# Patient Record
Sex: Female | Born: 1980 | Race: Asian | Hispanic: No | Marital: Married | Smoking: Never smoker
Health system: Southern US, Community
[De-identification: ages and names within clinical notes are randomized; demographics above are authoritative.]

## PROBLEM LIST (undated history)

## (undated) DIAGNOSIS — O24419 Gestational diabetes mellitus in pregnancy, unspecified control: Secondary | ICD-10-CM

## (undated) HISTORY — PX: NO PAST SURGERIES: SHX2092

---

## 2014-11-16 LAB — OB RESULTS CONSOLE HIV ANTIBODY (ROUTINE TESTING): HIV: NONREACTIVE

## 2014-11-16 LAB — OB RESULTS CONSOLE RPR: RPR: NONREACTIVE

## 2014-11-16 LAB — OB RESULTS CONSOLE HEPATITIS B SURFACE ANTIGEN: Hepatitis B Surface Ag: NEGATIVE

## 2014-11-16 LAB — OB RESULTS CONSOLE ABO/RH: RH Type: POSITIVE

## 2014-11-16 LAB — OB RESULTS CONSOLE RUBELLA ANTIBODY, IGM: Rubella: IMMUNE

## 2014-11-16 LAB — OB RESULTS CONSOLE ANTIBODY SCREEN: Antibody Screen: NEGATIVE

## 2014-11-26 LAB — OB RESULTS CONSOLE GC/CHLAMYDIA
CHLAMYDIA, DNA PROBE: NEGATIVE
GC PROBE AMP, GENITAL: NEGATIVE

## 2015-04-14 ENCOUNTER — Encounter: Payer: BLUE CROSS/BLUE SHIELD | Attending: Obstetrics & Gynecology | Admitting: *Deleted

## 2015-04-14 VITALS — Ht 62.0 in | Wt 118.7 lb

## 2015-04-14 DIAGNOSIS — R7302 Impaired glucose tolerance (oral): Secondary | ICD-10-CM | POA: Insufficient documentation

## 2015-04-14 DIAGNOSIS — R7309 Other abnormal glucose: Secondary | ICD-10-CM

## 2015-04-14 DIAGNOSIS — Z713 Dietary counseling and surveillance: Secondary | ICD-10-CM | POA: Diagnosis not present

## 2015-04-15 ENCOUNTER — Encounter: Payer: Self-pay | Admitting: *Deleted

## 2015-04-15 NOTE — Progress Notes (Signed)
  Patient was seen on 04/14/15 for Gestational Diabetes self-management visit at the [facility]. The following learning objectives were met by the patient during this visit:   States the definition of Gestational Diabetes  States why dietary management is important in controlling blood glucose  Describes the effects each nutrient has on blood glucose levels  Demonstrates ability to create a balanced meal plan  Demonstrates carbohydrate counting   States when to check blood glucose levels  Demonstrates proper blood glucose monitoring techniques  States the effect of stress and exercise on blood glucose levels  States the importance of limiting caffeine and abstaining from alcohol and smoking  Blood glucose monitor given:  One [employer] Self Monitoring Kit Lot # [id number] x Exp: 06/2016 Blood glucose reading: 96 mg/dl  Patient instructed to monitor glucose levels: FBS: 60 - <90 2 hour: <120  *Patient received handouts:  Nutrition Diabetes and Pregnancy  Carbohydrate Counting List  Patient will be seen for follow-up as needed.

## 2015-04-22 ENCOUNTER — Telehealth: Payer: Self-pay | Admitting: *Deleted

## 2015-04-22 NOTE — Telephone Encounter (Signed)
Husband states patient's BG too high @ 143 mg/dl a couple of days ago after eating a tangerine 1 hour after her lunch. Then yesterday they went to 121 mg/dl @ 2 hours after lunch. Follow up BG's were 97 and 107 mg/dl at 30 minute intervals after that. He questions the accuracy of the strips and whether they have the correct strips for the One Touch Verio Meter they are using.  I commented on the accuracy of current meters are required to have an accuracy within 15% so there will be some variation when checking BG close together. Also encouraged a protein to be included with pure carbohydrate foods to soften the BG excursion.  Suggested he call the meter company to obtain Control Solution so they can test the accuracy of the current vial of strips.

## 2015-05-31 LAB — OB RESULTS CONSOLE GBS: STREP GROUP B AG: NEGATIVE

## 2015-06-20 ENCOUNTER — Encounter (HOSPITAL_COMMUNITY)
Admission: AD | Disposition: A | Discharge status: Home / Self Care | Payer: Self-pay | Source: Ambulatory Visit | Attending: Obstetrics and Gynecology

## 2015-06-20 ENCOUNTER — Encounter (HOSPITAL_COMMUNITY): Payer: Self-pay | Admitting: *Deleted

## 2015-06-20 ENCOUNTER — Inpatient Hospital Stay (HOSPITAL_COMMUNITY): Payer: BLUE CROSS/BLUE SHIELD | Admitting: Anesthesiology

## 2015-06-20 ENCOUNTER — Inpatient Hospital Stay (HOSPITAL_COMMUNITY)
Admission: AD | Admit: 2015-06-20 | Discharge: 2015-06-20 | Disposition: A | Discharge status: Home / Self Care | Payer: BLUE CROSS/BLUE SHIELD | Source: Ambulatory Visit | Attending: Obstetrics and Gynecology | Admitting: Obstetrics and Gynecology

## 2015-06-20 ENCOUNTER — Inpatient Hospital Stay (HOSPITAL_COMMUNITY)
Admission: AD | Admit: 2015-06-20 | Discharge: 2015-06-23 | DRG: 766 | Disposition: A | Discharge status: Home / Self Care | Payer: BLUE CROSS/BLUE SHIELD | Source: Ambulatory Visit | Attending: Obstetrics and Gynecology | Admitting: Obstetrics and Gynecology

## 2015-06-20 DIAGNOSIS — Z3A39 39 weeks gestation of pregnancy: Secondary | ICD-10-CM

## 2015-06-20 DIAGNOSIS — O24420 Gestational diabetes mellitus in childbirth, diet controlled: Secondary | ICD-10-CM | POA: Diagnosis present

## 2015-06-20 DIAGNOSIS — E282 Polycystic ovarian syndrome: Secondary | ICD-10-CM | POA: Diagnosis present

## 2015-06-20 DIAGNOSIS — O4292 Full-term premature rupture of membranes, unspecified as to length of time between rupture and onset of labor: Secondary | ICD-10-CM | POA: Diagnosis present

## 2015-06-20 DIAGNOSIS — O322XX0 Maternal care for transverse and oblique lie, not applicable or unspecified: Secondary | ICD-10-CM | POA: Diagnosis present

## 2015-06-20 HISTORY — DX: Gestational diabetes mellitus in pregnancy, unspecified control: O24.419

## 2015-06-20 LAB — CBC
HCT: 37 % (ref 36.0–46.0)
HEMATOCRIT: 36.3 % (ref 36.0–46.0)
HEMOGLOBIN: 12.7 g/dL (ref 12.0–15.0)
HEMOGLOBIN: 12.3 g/dL (ref 12.0–15.0)
MCH: 32.5 pg (ref 26.0–34.0)
MCH: 32.3 pg (ref 26.0–34.0)
MCHC: 34.3 g/dL (ref 30.0–36.0)
MCHC: 33.9 g/dL (ref 30.0–36.0)
MCV: 94.6 fL (ref 78.0–100.0)
MCV: 95.3 fL (ref 78.0–100.0)
Platelets: 181 10*3/uL (ref 150–400)
Platelets: 175 10*3/uL (ref 150–400)
RBC: 3.91 MIL/uL (ref 3.87–5.11)
RBC: 3.81 MIL/uL — AB (ref 3.87–5.11)
RDW: 14.3 % (ref 11.5–15.5)
RDW: 14.4 % (ref 11.5–15.5)
WBC: 11.1 10*3/uL — ABNORMAL HIGH (ref 4.0–10.5)
WBC: 16.2 10*3/uL — ABNORMAL HIGH (ref 4.0–10.5)

## 2015-06-20 LAB — COMPREHENSIVE METABOLIC PANEL
ALK PHOS: 144 U/L — AB (ref 38–126)
ALT: 39 U/L (ref 14–54)
AST: 33 U/L (ref 15–41)
Albumin: 2.7 g/dL — ABNORMAL LOW (ref 3.5–5.0)
Anion gap: 9 (ref 5–15)
BUN: 10 mg/dL (ref 6–20)
CHLORIDE: 106 mmol/L (ref 101–111)
CO2: 21 mmol/L — AB (ref 22–32)
CREATININE: 0.87 mg/dL (ref 0.44–1.00)
Calcium: 8.5 mg/dL — ABNORMAL LOW (ref 8.9–10.3)
GFR calc Af Amer: >60 mL/min (ref >60)
GFR calc non Af Amer: >60 mL/min (ref >60)
GLUCOSE: 120 mg/dL — AB (ref 65–99)
Potassium: 3.8 mmol/L (ref 3.5–5.1)
SODIUM: 136 mmol/L (ref 135–145)
Total Bilirubin: 1 mg/dL (ref 0.3–1.2)
Total Protein: 6 g/dL — ABNORMAL LOW (ref 6.5–8.1)

## 2015-06-20 LAB — GLUCOSE, CAPILLARY
GLUCOSE-CAPILLARY: 75 mg/dL (ref 65–99)
Glucose-Capillary: 126 mg/dL — ABNORMAL HIGH (ref 65–99)
Glucose-Capillary: 111 mg/dL — ABNORMAL HIGH (ref 65–99)

## 2015-06-20 LAB — ABO/RH: ABO/RH(D): O POS

## 2015-06-20 LAB — PREPARE RBC (CROSSMATCH)

## 2015-06-20 SURGERY — Surgical Case
Anesthesia: Epidural

## 2015-06-20 MED ORDER — OXYTOCIN 10 UNIT/ML IJ SOLN
INTRAMUSCULAR | Status: AC
  Filled 2015-06-20: qty 4

## 2015-06-20 MED ORDER — TERBUTALINE SULFATE 1 MG/ML IJ SOLN: 0.2500 mg | Freq: Once | INTRAMUSCULAR | Status: DC | PRN

## 2015-06-20 MED ORDER — OXYCODONE-ACETAMINOPHEN 5-325 MG PO TABS: 1.0000 | ORAL_TABLET | ORAL | Status: DC | PRN

## 2015-06-20 MED ORDER — BUPIVACAINE HCL (PF) 0.25 % IJ SOLN
INTRAMUSCULAR | Status: AC
  Filled 2015-06-20: qty 30

## 2015-06-20 MED ORDER — KETOROLAC TROMETHAMINE 30 MG/ML IJ SOLN: 30.0000 mg | Freq: Four times a day (QID) | INTRAMUSCULAR | Status: DC | PRN

## 2015-06-20 MED ORDER — FENTANYL 2.5 MCG/ML BUPIVACAINE 1/10 % EPIDURAL INFUSION (WH - ANES): 14.0000 mL/h | INTRAMUSCULAR | Status: DC | PRN

## 2015-06-20 MED ORDER — LIDOCAINE HCL (PF) 1 % IJ SOLN
INTRAMUSCULAR | Status: DC | PRN
  Administered 2015-06-20 (×2): 5 mL

## 2015-06-20 MED ORDER — ACETAMINOPHEN 325 MG PO TABS: 650.0000 mg | ORAL_TABLET | ORAL | Status: DC | PRN

## 2015-06-20 MED ORDER — OXYCODONE-ACETAMINOPHEN 5-325 MG PO TABS: 2.0000 | ORAL_TABLET | ORAL | Status: DC | PRN

## 2015-06-20 MED ORDER — MORPHINE SULFATE (PF) 0.5 MG/ML IJ SOLN
INTRAMUSCULAR | Status: AC
  Filled 2015-06-20: qty 10

## 2015-06-20 MED ORDER — OXYTOCIN BOLUS FROM INFUSION: 500.0000 mL | INTRAVENOUS | Status: DC

## 2015-06-20 MED ORDER — FENTANYL 2.5 MCG/ML BUPIVACAINE 1/10 % EPIDURAL INFUSION (WH - ANES)
14.0000 mL/h | INTRAMUSCULAR | Status: DC | PRN
  Administered 2015-06-20: 14 mL/h via EPIDURAL
  Filled 2015-06-20 (×2): qty 125

## 2015-06-20 MED ORDER — SODIUM BICARBONATE 8.4 % IV SOLN
INTRAVENOUS | Status: DC | PRN
  Administered 2015-06-20: 2 mL via EPIDURAL
  Administered 2015-06-20 (×3): 5 mL via EPIDURAL

## 2015-06-20 MED ORDER — ONDANSETRON HCL 4 MG/2ML IJ SOLN
4.0000 mg | Freq: Four times a day (QID) | INTRAMUSCULAR | Status: DC | PRN
  Administered 2015-06-20 (×2): 4 mg via INTRAVENOUS
  Filled 2015-06-20: qty 2

## 2015-06-20 MED ORDER — EPHEDRINE 5 MG/ML INJ: 10.0000 mg | INTRAVENOUS | Status: DC | PRN

## 2015-06-20 MED ORDER — METOCLOPRAMIDE HCL 5 MG/ML IJ SOLN: 10.0000 mg | Freq: Once | INTRAMUSCULAR | Status: DC | PRN

## 2015-06-20 MED ORDER — MORPHINE SULFATE (PF) 0.5 MG/ML IJ SOLN
INTRAMUSCULAR | Status: DC | PRN
  Administered 2015-06-20 (×2): 1 mg via INTRAVENOUS
  Administered 2015-06-20: 3 mg via EPIDURAL

## 2015-06-20 MED ORDER — BUPIVACAINE HCL (PF) 0.25 % IJ SOLN
INTRAMUSCULAR | Status: DC | PRN
  Administered 2015-06-20: 8 mL

## 2015-06-20 MED ORDER — OXYTOCIN 10 UNIT/ML IJ SOLN
40.0000 [IU] | INTRAMUSCULAR | Status: DC | PRN
  Administered 2015-06-20: 40 [IU] via INTRAVENOUS

## 2015-06-20 MED ORDER — FENTANYL 2.5 MCG/ML BUPIVACAINE 1/10 % EPIDURAL INFUSION (WH - ANES)
INTRAMUSCULAR | Status: DC | PRN
  Administered 2015-06-20: 14 mL/h via EPIDURAL

## 2015-06-20 MED ORDER — ONDANSETRON HCL 4 MG/2ML IJ SOLN
INTRAMUSCULAR | Status: AC
  Filled 2015-06-20: qty 4

## 2015-06-20 MED ORDER — OXYTOCIN 40 UNITS IN LACTATED RINGERS INFUSION - SIMPLE MED
62.5000 mL/h | INTRAVENOUS | Status: DC
  Filled 2015-06-20: qty 1000

## 2015-06-20 MED ORDER — CEFAZOLIN SODIUM-DEXTROSE 2-3 GM-% IV SOLR
INTRAVENOUS | Status: AC
  Filled 2015-06-20: qty 50

## 2015-06-20 MED ORDER — METHYLERGONOVINE MALEATE 0.2 MG/ML IJ SOLN
INTRAMUSCULAR | Status: DC | PRN
  Administered 2015-06-20: 0.2 mg via INTRAMUSCULAR

## 2015-06-20 MED ORDER — MEPERIDINE HCL 25 MG/ML IJ SOLN: 6.2500 mg | INTRAMUSCULAR | Status: DC | PRN

## 2015-06-20 MED ORDER — FENTANYL CITRATE (PF) 100 MCG/2ML IJ SOLN: 25.0000 ug | INTRAMUSCULAR | Status: DC | PRN

## 2015-06-20 MED ORDER — LIDOCAINE HCL (PF) 1 % IJ SOLN
30.0000 mL | INTRAMUSCULAR | Status: DC | PRN
  Filled 2015-06-20: qty 30

## 2015-06-20 MED ORDER — DIPHENHYDRAMINE HCL 50 MG/ML IJ SOLN: 12.5000 mg | INTRAMUSCULAR | Status: DC | PRN

## 2015-06-20 MED ORDER — LACTATED RINGERS IV SOLN
INTRAVENOUS | Status: DC
  Administered 2015-06-20 (×5): via INTRAVENOUS

## 2015-06-20 MED ORDER — METHYLERGONOVINE MALEATE 0.2 MG/ML IJ SOLN
INTRAMUSCULAR | Status: AC
  Filled 2015-06-20: qty 1

## 2015-06-20 MED ORDER — OXYTOCIN 40 UNITS IN LACTATED RINGERS INFUSION - SIMPLE MED
1.0000 m[IU]/min | INTRAVENOUS | Status: DC
  Administered 2015-06-20: 2 m[IU]/min via INTRAVENOUS

## 2015-06-20 MED ORDER — LACTATED RINGERS IV SOLN
500.0000 mL | INTRAVENOUS | Status: DC | PRN
  Administered 2015-06-20: 500 mL via INTRAVENOUS

## 2015-06-20 MED ORDER — PHENYLEPHRINE 40 MCG/ML (10ML) SYRINGE FOR IV PUSH (FOR BLOOD PRESSURE SUPPORT)
80.0000 ug | PREFILLED_SYRINGE | INTRAVENOUS | Status: DC | PRN
  Filled 2015-06-20: qty 20

## 2015-06-20 MED ORDER — CITRIC ACID-SODIUM CITRATE 334-500 MG/5ML PO SOLN
30.0000 mL | ORAL | Status: DC | PRN
  Administered 2015-06-20: 30 mL via ORAL
  Filled 2015-06-20: qty 15

## 2015-06-20 MED ORDER — CEFAZOLIN SODIUM-DEXTROSE 2-3 GM-% IV SOLR
INTRAVENOUS | Status: DC | PRN
  Administered 2015-06-20: 2 g via INTRAVENOUS

## 2015-06-20 MED ORDER — FLEET ENEMA 7-19 GM/118ML RE ENEM: 1.0000 | ENEMA | RECTAL | Status: DC | PRN

## 2015-06-20 SURGICAL SUPPLY — 47 items
BARRIER ADHS 3X4 INTERCEED (GAUZE/BANDAGES/DRESSINGS) ×2 IMPLANT
BENZOIN TINCTURE PRP APPL 2/3 (GAUZE/BANDAGES/DRESSINGS) IMPLANT
CLAMP CORD UMBIL (MISCELLANEOUS) IMPLANT
CLOTH BEACON ORANGE TIMEOUT ST (SAFETY) ×2 IMPLANT
CONTAINER PREFILL 10% NBF 15ML (MISCELLANEOUS) IMPLANT
DRAPE C SECTION CLR SCREEN (DRAPES) ×2 IMPLANT
DRAPE SHEET LG 3/4 BI-LAMINATE (DRAPES) IMPLANT
DRSG OPSITE POSTOP 4X10 (GAUZE/BANDAGES/DRESSINGS) ×2 IMPLANT
DRSG TELFA 3X8 NADH (GAUZE/BANDAGES/DRESSINGS) ×2 IMPLANT
DURAPREP 26ML APPLICATOR (WOUND CARE) ×2 IMPLANT
ELECT REM PT RETURN 9FT ADLT (ELECTROSURGICAL) ×2
ELECTRODE REM PT RTRN 9FT ADLT (ELECTROSURGICAL) ×1 IMPLANT
EXTRACTOR VACUUM M CUP 4 TUBE (SUCTIONS) IMPLANT
GLOVE BIOGEL PI IND STRL 7.0 (GLOVE) ×2 IMPLANT
GLOVE BIOGEL PI INDICATOR 7.0 (GLOVE) ×2
GLOVE ECLIPSE 6.5 STRL STRAW (GLOVE) ×2 IMPLANT
GOWN STRL REUS W/TWL LRG LVL3 (GOWN DISPOSABLE) ×4 IMPLANT
KIT ABG SYR 3ML LUER SLIP (SYRINGE) IMPLANT
NEEDLE HYPO 22GX1.5 SAFETY (NEEDLE) ×2 IMPLANT
NEEDLE HYPO 25X5/8 SAFETYGLIDE (NEEDLE) IMPLANT
NS IRRIG 1000ML POUR BTL (IV SOLUTION) ×2 IMPLANT
PACK C SECTION WH (CUSTOM PROCEDURE TRAY) ×2 IMPLANT
PAD ABD 7.5X8 STRL (GAUZE/BANDAGES/DRESSINGS) ×4 IMPLANT
PAD OB MATERNITY 4.3X12.25 (PERSONAL CARE ITEMS) ×2 IMPLANT
RTRCTR C-SECT PINK 25CM LRG (MISCELLANEOUS) IMPLANT
SPONGE GAUZE 4X4 12PLY STER LF (GAUZE/BANDAGES/DRESSINGS) ×4 IMPLANT
SPONGE LAP 18X18 X RAY DECT (DISPOSABLE) ×4 IMPLANT
STAPLER VISISTAT 35W (STAPLE) ×2 IMPLANT
STRIP CLOSURE SKIN 1/2X4 (GAUZE/BANDAGES/DRESSINGS) IMPLANT
SUT CHROMIC GUT AB #0 18 (SUTURE) IMPLANT
SUT MNCRL 0 VIOLET CTX 36 (SUTURE) ×4 IMPLANT
SUT MON AB 2-0 SH 27 (SUTURE)
SUT MON AB 2-0 SH27 (SUTURE) IMPLANT
SUT MON AB 3-0 SH 27 (SUTURE)
SUT MON AB 3-0 SH27 (SUTURE) IMPLANT
SUT MON AB 4-0 PS1 27 (SUTURE) IMPLANT
SUT MONOCRYL 0 CTX 36 (SUTURE) ×4
SUT PLAIN 2 0 (SUTURE)
SUT PLAIN 2 0 XLH (SUTURE) IMPLANT
SUT PLAIN ABS 2-0 CT1 27XMFL (SUTURE) IMPLANT
SUT VIC AB 0 CT1 36 (SUTURE) ×4 IMPLANT
SUT VIC AB 2-0 CT1 27 (SUTURE) ×1
SUT VIC AB 2-0 CT1 TAPERPNT 27 (SUTURE) ×1 IMPLANT
SUT VIC AB 4-0 PS2 27 (SUTURE) IMPLANT
SYR CONTROL 10ML LL (SYRINGE) ×2 IMPLANT
TOWEL OR 17X24 6PK STRL BLUE (TOWEL DISPOSABLE) ×2 IMPLANT
TRAY FOLEY CATH SILVER 14FR (SET/KITS/TRAYS/PACK) IMPLANT

## 2015-06-20 NOTE — MAU Note (Signed)
Patient presents with PROM @ 0800

## 2015-06-20 NOTE — Progress Notes (Signed)
S; pushing for 1 1/2 hours  O: tracing: baseline 140 (+) accels Ctx q 2-4 mins  VE . Fully (+) 2 caput LOT  IMP: Complete Transverse presentation Class a1 GDM P) cont pushing ( RN plans hand and knees)

## 2015-06-20 NOTE — Anesthesia Postprocedure Evaluation (Signed)
Anesthesia Post Note  Patient: Lori Robertson  Procedure(s) Performed: Procedure(s) (LRB): CESAREAN SECTION (N/A)  Patient location during evaluation: PACU Anesthesia Type: Epidural Level of consciousness: awake and alert Pain management: pain level controlled Vital Signs Assessment: post-procedure vital signs reviewed and stable Respiratory status: spontaneous breathing, nonlabored ventilation and respiratory function stable Cardiovascular status: blood pressure returned to baseline Postop Assessment: no headache, no backache, epidural receding, patient able to bend at knees and no signs of nausea or vomiting Anesthetic complications: no    Last Vitals:  Filed Vitals:   06/20/15 2315 06/20/15 2330  BP: 102/68 106/75  Pulse: 85 81  Temp:  36.9 C  Resp: 18 17    Last Pain:  Filed Vitals:   06/20/15 2344  PainSc: 0-No pain                 [name] A.

## 2015-06-20 NOTE — Anesthesia Preprocedure Evaluation (Addendum)
Anesthesia Evaluation  Patient identified by MRN, date of birth, ID band Patient awake and Patient confused    Reviewed: Allergy & Precautions, H&P , NPO status , Patient's Chart, lab work & pertinent test results  Airway Mallampati: II       Dental   Pulmonary    Pulmonary exam normal breath sounds clear to auscultation       Cardiovascular Exercise Tolerance: Good Normal cardiovascular exam Rhythm:regular Rate:Normal     Neuro/Psych    GI/Hepatic   Endo/Other  diabetes, Well Controlled, Gestational  Renal/GU      Musculoskeletal   Abdominal   Peds  Hematology   Anesthesia Other Findings   Reproductive/Obstetrics (+) Pregnancy                            Anesthesia Physical Anesthesia Plan  ASA: II and emergent  Anesthesia Plan: Epidural   Post-op Pain Management:    Induction:   Airway Management Planned: Natural Airway  Additional Equipment:   Intra-op Plan:   Post-operative Plan:   Informed Consent: I have reviewed the patients History and Physical, chart, labs and discussed the procedure including the risks, benefits and alternatives for the proposed anesthesia with the patient or authorized representative who has indicated his/her understanding and acceptance.   Dental advisory given  Plan Discussed with: Anesthesiologist, CRNA and Surgeon  Anesthesia Plan Comments: (Patient for C/Section for failure to progress. Will use epidural for C/Section. M. Malen GauzeFoster, MD)       Anesthesia Quick Evaluation

## 2015-06-20 NOTE — MAU Note (Signed)
Pt reports ctx off and on all evening. Reports some bloody show and good fetal movement.

## 2015-06-20 NOTE — Anesthesia Procedure Notes (Signed)
Epidural Patient location during procedure: OB Start time: 06/20/2015 11:20 AM End time: 06/20/2015 11:40 AM  Staffing Anesthesiologist: Sebastian AcheMANNY, Jerrye Seebeck  Preanesthetic Checklist Completed: patient identified, site marked, surgical consent, pre-op evaluation, timeout performed, IV checked, risks and benefits discussed and monitors and equipment checked  Epidural Patient position: sitting Prep: site prepped and draped and DuraPrep Patient monitoring: heart rate, continuous pulse ox and blood pressure Approach: midline Location: L3-L4 Injection technique: LOR air  Needle:  Needle type: Tuohy  Needle gauge: 17 G Needle length: 9 cm and 9 Needle insertion depth: 5 cm Catheter type: closed end flexible Catheter size: 20 Guage Catheter at skin depth: 13 cm Test dose: negative  Assessment Events: blood not aspirated, injection not painful, no injection resistance, negative IV test and no paresthesia  Additional Notes   Patient tolerated the insertion well without complications.Reason for block:procedure for pain

## 2015-06-20 NOTE — Progress Notes (Signed)
Lori Robertson is a 34 y.o. G1P0 at 233w0d by ultrasound admitted for rupture of membranes  Subjective: Chief Complaint  Patient presents with  . Rupture of Membranes    Objective: BP 105/64 mmHg  Pulse 73  Temp(Src) 98.7 F (37.1 C) (Oral)  Resp 18  Ht 5\' 1"  (1.549 m)  Wt 54.885 kg (121 lb)  BMI 22.87 kg/m2  SpO2 98%      FHT:  FHR: 140 bpm, variability: moderate,  accelerations:  Present,  decelerations:  Absent UC:   regular, every 2-3 minutes SVE:   10/100/0 +1 station   Labs: Lab Results  Component Value Date   WBC 11.1* 06/20/2015   HGB 12.7 06/20/2015   HCT 37.0 06/20/2015   MCV 94.6 06/20/2015   PLT 181 06/20/2015    Assessment / Plan: complete  Class a1GDM P) labor vtx down   Anticipated MOD:  NSVD  Gotham Raden A 06/20/2015, 5:28 PM

## 2015-06-20 NOTE — H&P (Signed)
Lori Robertson is a 34 y.o. female presenting for labor and SROM at 0800.  Maternal Medical History:  Reason for admission: Rupture of membranes and contractions.   Contractions: Onset was 3-5 hours ago.   Frequency: irregular.   Perceived severity is mild.    Fetal activity: Perceived fetal activity is normal.   Last perceived fetal movement was within the past hour.    Prenatal complications: no prenatal complications Prenatal Complications - Diabetes: gestational.    OB History    Gravida Para Term Preterm AB TAB SAB Ectopic Multiple Living   1              Past Medical History  Diagnosis Date  . Gestational diabetes    Past Surgical History  Procedure Laterality Date  . No past surgeries     Family History: family history is not on file. Social History:  reports that she has never smoked. She does not have any smokeless tobacco history on file. She reports that she does not drink alcohol or use illicit drugs.   Prenatal Transfer Tool  Maternal Diabetes: Yes:  Diabetes Type:  Diet controlled Genetic Screening: Normal Maternal Ultrasounds/Referrals: Normal Fetal Ultrasounds or other Referrals:  None Maternal Substance Abuse:  No Significant Maternal Medications:  None Significant Maternal Lab Results:  None Other Comments:  None  Review of Systems  Constitutional: Negative.   All other systems reviewed and are negative.     Blood pressure 115/78, pulse 70, temperature 98.1 F (36.7 C), temperature source Oral, resp. rate 17. Maternal Exam:  Uterine Assessment: Contraction strength is mild.  Contraction frequency is irregular.   Abdomen: Patient reports no abdominal tenderness. Fetal presentation: vertex  Introitus: Normal vulva. Normal vagina.  Ferning test: positive.  Nitrazine test: positive. Amniotic fluid character: clear.  Pelvis: questionable for delivery.   Cervix: Cervix evaluated by digital exam.     Physical Exam  Nursing note and vitals  reviewed. Constitutional: She is oriented to person, place, and time. She appears well-developed and well-nourished.  HENT:  Head: Normocephalic and atraumatic.  Neck: Normal range of motion. Neck supple.  Cardiovascular: Normal rate and regular rhythm.   Respiratory: Effort normal and breath sounds normal.  GI: Soft. Bowel sounds are normal.  Genitourinary: Vagina normal and uterus normal.  Musculoskeletal: Normal range of motion.  Neurological: She is alert and oriented to person, place, and time. She has normal reflexes.  Skin: Skin is warm and dry.  Psychiatric: She has a normal mood and affect.    Prenatal labs: ABO, Rh: O/Positive/-- (05/03 0000) Antibody: Negative (05/03 0000) Rubella: Immune (05/03 0000) RPR: Nonreactive (05/03 0000)  HBsAg: Negative (05/03 0000)  HIV: Non-reactive (05/03 0000)  GBS: Negative (11/15 0000)   Assessment/Plan: SROM at term GDM Admit   Tyray Proch J 06/20/2015, 8:55 AM

## 2015-06-20 NOTE — Brief Op Note (Signed)
06/20/2015  10:55 PM  PATIENT:  Lori Robertson  34 y.o. female  PRE-OPERATIVE DIAGNOSIS:  Arrest of Descent, Class A1 GDM, Term gestation  POST-OPERATIVE DIAGNOSIS:  Arrest of Descent, Class A1 GDM, Term gestation,  PROCEDURE:  Primary Cesarean section, Sharl MaKerr Hysterotomy  SURGEON:  Surgeon(s) and Role:    * Maxie BetterSheronette Cainen Burnham, MD - Primary  PHYSICIAN ASSISTANT:   ASSISTANTS: Donette LarryMelanie Bhambri, CNM   ANESTHESIA:   epidural FINDINGS: 2.5-3.0 cm post left SS fibroid, nl tubes, polycystic ovaries, live female ROA Asynclitic,  Apgar  8/9  EBL:  Total I/O In: 2500 [I.V.:2500] Out: 1300 [Urine:400; Blood:900]  BLOOD ADMINISTERED:none  DRAINS: none   LOCAL MEDICATIONS USED:  MARCAINE     SPECIMEN:  No Specimen  DISPOSITION OF SPECIMEN:  N/A  COUNTS:  YES  TOURNIQUET:  * No tourniquets in log *  DICTATION: .Other Dictation: Dictation Number [id number]  PLAN OF CARE: Admit to inpatient   PATIENT DISPOSITION:  PACU - hemodynamically stable.   Delay start of Pharmacological VTE agent (>24hrs) due to surgical blood loss or risk of bleeding: no

## 2015-06-20 NOTE — MAU Note (Signed)
Notified provider that patient came in and is fern test positive. Provider said to admit patient.

## 2015-06-20 NOTE — Transfer of Care (Signed)
Immediate Anesthesia Transfer of Care Note  Patient: Lori Robertson  Procedure(s) Performed: Procedure(s): CESAREAN SECTION (N/A)  Patient Location: PACU  Anesthesia Type:Epidural  Level of Consciousness: awake, alert  and oriented  Airway & Oxygen Therapy: Patient Spontanous Breathing  Post-op Assessment: Report given to RN and Post -op Vital signs reviewed and stable  Post vital signs: Reviewed and stable  Last Vitals:  Filed Vitals:   06/20/15 2134 06/20/15 2136  BP: 107/65 104/71  Pulse: 85 84  Temp:  37.6 C  Resp:  16    Complications: No apparent anesthesia complications

## 2015-06-20 NOTE — Consult Note (Signed)
[facility]  Delivery Note:  C-section       06/20/2015  10:07 PM  I was called to the operating room at the request of the patient's obstetrician (Dr. Cousins) for a primary c-section.  PRENATAL HX:  This is a 34 y/o G1P0 at 39 weeks admitted this morning for SROM at 0800 (ROM 14 hours).  Her pregnancy has been complicated by diet controlled GDM.  Delivery is by c-section for failure to progress.   DELIVERY:  Infant was vigorous at delivery, requiring no resuscitation other than standard warming, drying and stimulation.  APGARs 8 and 9.  Exam notable for mild molding and moderate caput, otherwise within normal limits.  After 5 minutes, baby left with nurse to assist parents with skin-to-skin care.   _____________________ Electronically Signed By: Timea Breed, MD Neonatologist 

## 2015-06-20 NOTE — Progress Notes (Signed)
CBG 75. °

## 2015-06-21 ENCOUNTER — Encounter (HOSPITAL_COMMUNITY): Payer: Self-pay | Admitting: Anesthesiology

## 2015-06-21 ENCOUNTER — Encounter (HOSPITAL_COMMUNITY): Payer: Self-pay | Admitting: Obstetrics and Gynecology

## 2015-06-21 LAB — DIC (DISSEMINATED INTRAVASCULAR COAGULATION)PANEL
Fibrinogen: 478 mg/dL — ABNORMAL HIGH (ref 204–475)
Platelets: 160 10*3/uL (ref 150–400)
Smear Review: NONE SEEN
aPTT: 26 seconds (ref 24–37)

## 2015-06-21 LAB — CBC
HCT: 35.1 % — ABNORMAL LOW (ref 36.0–46.0)
Hemoglobin: 12.1 g/dL (ref 12.0–15.0)
MCH: 32.4 pg (ref 26.0–34.0)
MCHC: 34.5 g/dL (ref 30.0–36.0)
MCV: 93.9 fL (ref 78.0–100.0)
PLATELETS: 166 10*3/uL (ref 150–400)
RBC: 3.74 MIL/uL — ABNORMAL LOW (ref 3.87–5.11)
RDW: 14.4 % (ref 11.5–15.5)
WBC: 13.5 10*3/uL — ABNORMAL HIGH (ref 4.0–10.5)

## 2015-06-21 LAB — DIC (DISSEMINATED INTRAVASCULAR COAGULATION) PANEL
D DIMER QUANT: 8.49 ug{FEU}/mL — AB (ref 0.00–0.50)
INR: 0.91 (ref 0.00–1.49)
PROTHROMBIN TIME: 12.5 s (ref 11.6–15.2)

## 2015-06-21 LAB — RPR: RPR: NONREACTIVE

## 2015-06-21 LAB — GLUCOSE, CAPILLARY: GLUCOSE-CAPILLARY: 101 mg/dL — AB (ref 65–99)

## 2015-06-21 MED ORDER — LACTATED RINGERS IV SOLN
INTRAVENOUS | Status: DC
  Administered 2015-06-21: 125 mL/h via INTRAVENOUS

## 2015-06-21 MED ORDER — DIBUCAINE 1 % RE OINT: 1.0000 "application " | TOPICAL_OINTMENT | RECTAL | Status: DC | PRN

## 2015-06-21 MED ORDER — OXYTOCIN 40 UNITS IN LACTATED RINGERS INFUSION - SIMPLE MED: 62.5000 mL/h | INTRAVENOUS | Status: AC

## 2015-06-21 MED ORDER — ZOLPIDEM TARTRATE 5 MG PO TABS: 5.0000 mg | ORAL_TABLET | Freq: Every evening | ORAL | Status: DC | PRN

## 2015-06-21 MED ORDER — OXYCODONE-ACETAMINOPHEN 5-325 MG PO TABS
1.0000 | ORAL_TABLET | ORAL | Status: DC | PRN
  Administered 2015-06-22 – 2015-06-23 (×3): 1 via ORAL
  Filled 2015-06-21 (×3): qty 1

## 2015-06-21 MED ORDER — NALOXONE HCL 0.4 MG/ML IJ SOLN: 0.4000 mg | INTRAMUSCULAR | Status: DC | PRN

## 2015-06-21 MED ORDER — FLEET ENEMA 7-19 GM/118ML RE ENEM: 1.0000 | ENEMA | Freq: Every day | RECTAL | Status: DC | PRN

## 2015-06-21 MED ORDER — DIPHENHYDRAMINE HCL 25 MG PO CAPS: 25.0000 mg | ORAL_CAPSULE | Freq: Four times a day (QID) | ORAL | Status: DC | PRN

## 2015-06-21 MED ORDER — SIMETHICONE 80 MG PO CHEW
80.0000 mg | CHEWABLE_TABLET | ORAL | Status: DC
  Administered 2015-06-22 – 2015-06-23 (×2): 80 mg via ORAL
  Filled 2015-06-21 (×2): qty 1

## 2015-06-21 MED ORDER — NALBUPHINE HCL 10 MG/ML IJ SOLN: 5.0000 mg | INTRAMUSCULAR | Status: DC | PRN

## 2015-06-21 MED ORDER — SIMETHICONE 80 MG PO CHEW
80.0000 mg | CHEWABLE_TABLET | ORAL | Status: DC | PRN
  Administered 2015-06-22: 80 mg via ORAL

## 2015-06-21 MED ORDER — IBUPROFEN 600 MG PO TABS
600.0000 mg | ORAL_TABLET | Freq: Four times a day (QID) | ORAL | Status: DC | PRN
  Administered 2015-06-21: 600 mg via ORAL

## 2015-06-21 MED ORDER — NALOXONE HCL 2 MG/2ML IJ SOSY
1.0000 ug/kg/h | PREFILLED_SYRINGE | INTRAMUSCULAR | Status: DC | PRN
  Filled 2015-06-21: qty 2

## 2015-06-21 MED ORDER — SODIUM CHLORIDE 0.9 % IJ SOLN
3.0000 mL | Freq: Two times a day (BID) | INTRAMUSCULAR | Status: DC
Start: 2015-06-21 — End: 2015-06-22
  Administered 2015-06-21: 3 mL via INTRAVENOUS

## 2015-06-21 MED ORDER — BISACODYL 10 MG RE SUPP: 10.0000 mg | Freq: Every day | RECTAL | Status: DC | PRN

## 2015-06-21 MED ORDER — METHYLERGONOVINE MALEATE 0.2 MG PO TABS
0.2000 mg | ORAL_TABLET | Freq: Four times a day (QID) | ORAL | Status: DC
  Administered 2015-06-21 – 2015-06-22 (×5): 0.2 mg via ORAL
  Filled 2015-06-21 (×5): qty 1

## 2015-06-21 MED ORDER — IBUPROFEN 600 MG PO TABS
600.0000 mg | ORAL_TABLET | Freq: Four times a day (QID) | ORAL | Status: DC
  Administered 2015-06-21 – 2015-06-23 (×8): 600 mg via ORAL
  Filled 2015-06-21 (×10): qty 1

## 2015-06-21 MED ORDER — FERROUS SULFATE 325 (65 FE) MG PO TABS
325.0000 mg | ORAL_TABLET | Freq: Two times a day (BID) | ORAL | Status: DC
  Administered 2015-06-21 – 2015-06-22 (×3): 325 mg via ORAL
  Filled 2015-06-21 (×3): qty 1

## 2015-06-21 MED ORDER — MENTHOL 3 MG MT LOZG: 1.0000 | LOZENGE | OROMUCOSAL | Status: DC | PRN

## 2015-06-21 MED ORDER — NALBUPHINE HCL 10 MG/ML IJ SOLN: 5.0000 mg | Freq: Once | INTRAMUSCULAR | Status: DC | PRN

## 2015-06-21 MED ORDER — DIPHENHYDRAMINE HCL 50 MG/ML IJ SOLN: 12.5000 mg | INTRAMUSCULAR | Status: DC | PRN

## 2015-06-21 MED ORDER — SODIUM CHLORIDE 0.9 % IV SOLN: 250.0000 mL | INTRAVENOUS | Status: DC

## 2015-06-21 MED ORDER — ONDANSETRON HCL 4 MG/2ML IJ SOLN: 4.0000 mg | Freq: Three times a day (TID) | INTRAMUSCULAR | Status: DC | PRN

## 2015-06-21 MED ORDER — DIPHENHYDRAMINE HCL 25 MG PO CAPS: 25.0000 mg | ORAL_CAPSULE | ORAL | Status: DC | PRN

## 2015-06-21 MED ORDER — SENNOSIDES-DOCUSATE SODIUM 8.6-50 MG PO TABS
2.0000 | ORAL_TABLET | ORAL | Status: DC
  Administered 2015-06-22 – 2015-06-23 (×2): 2 via ORAL
  Filled 2015-06-21 (×2): qty 2

## 2015-06-21 MED ORDER — METHYLERGONOVINE MALEATE 0.2 MG/ML IJ SOLN: 0.2000 mg | Freq: Four times a day (QID) | INTRAMUSCULAR | Status: DC

## 2015-06-21 MED ORDER — PRENATAL MULTIVITAMIN CH
1.0000 | ORAL_TABLET | Freq: Every day | ORAL | Status: DC
  Administered 2015-06-21 – 2015-06-23 (×3): 1 via ORAL
  Filled 2015-06-21 (×3): qty 1

## 2015-06-21 MED ORDER — SODIUM CHLORIDE 0.9 % IJ SOLN: 3.0000 mL | INTRAMUSCULAR | Status: DC | PRN

## 2015-06-21 MED ORDER — SIMETHICONE 80 MG PO CHEW
80.0000 mg | CHEWABLE_TABLET | Freq: Three times a day (TID) | ORAL | Status: DC
  Administered 2015-06-21 – 2015-06-23 (×7): 80 mg via ORAL
  Filled 2015-06-21 (×8): qty 1

## 2015-06-21 MED ORDER — WITCH HAZEL-GLYCERIN EX PADS: 1.0000 "application " | MEDICATED_PAD | CUTANEOUS | Status: DC | PRN

## 2015-06-21 MED ORDER — ACETAMINOPHEN 325 MG PO TABS
650.0000 mg | ORAL_TABLET | ORAL | Status: DC | PRN
  Administered 2015-06-21: 650 mg via ORAL
  Filled 2015-06-21: qty 2

## 2015-06-21 MED ORDER — LANOLIN HYDROUS EX OINT: 1.0000 "application " | TOPICAL_OINTMENT | CUTANEOUS | Status: DC | PRN

## 2015-06-21 MED ORDER — OXYCODONE-ACETAMINOPHEN 5-325 MG PO TABS: 2.0000 | ORAL_TABLET | ORAL | Status: DC | PRN

## 2015-06-21 NOTE — Op Note (Signed)
Lori Robertson, Lori Robertson                    ACCOUNT NO.:  [id number]  MEDICAL RECORD NO.:  [id number]  LOCATION:  [facility]                          FACILITY:  [facility]  PHYSICIAN:  Maxie Better, M.D.DATE OF BIRTH:  06-Nov-1980  DATE OF PROCEDURE:  06/20/2015 DATE OF DISCHARGE:                              OPERATIVE REPORT   PREOPERATIVE DIAGNOSIS:  Arrest of descent, class A1 gestational diabetes, term gestation.  PROCEDURE:  Primary cesarean section, Kerr hysterotomy.  POSTOPERATIVE DIAGNOSIS:  Arrest of descent, class A1 gestational diabetes, term gestation.  ANESTHESIA:  Epidural.  SURGEON:  Maxie Better, MD  ASSISTANT:  Donette Larry, CNM  PROCEDURE IN DETAIL:  Under adequate epidural anesthesia, the patient was placed in the supine position with a left lateral tilt.  She was sterilely prepped and draped in usual fashion.  Indwelling Foley catheter was already in place.  0.25% Marcaine was injected along the planned Pfannenstiel skin incision.  Pfannenstiel skin incision was then made, carried down to the rectus fascia.  Rectus fascia was opened transversely.  The rectus fascia was then bluntly and sharply dissected off the rectus muscle in superior and inferior fashion.  The rectus muscle was split in midline.  The parietal peritoneum was entered bluntly and extended.  A self-retaining Alexis retractor was then placed.  The vesicouterine peritoneum was opened transversely.  The bladder was then bluntly dissected off the lower uterine segment and displaced inferiorly.  A curvilinear low transverse uterine incision was then made and extended with bandage scissors.  The attempted initial delivery of the baby who was in the right occiput anterior position with asynclitic head was unsuccessful and assistance from the nurse to push the baby upward was requested and performed with subsequently dislodgement of the head and delivery of a live female who was bulb suctioned.  The  abdomen and cord were clamped, cut.  The baby was transferred to the awaiting pediatricians who assigned Apgars 8 and 9 at 1 and 5 minutes.  The placenta was manually removed.  Uterine cavity was cleaned of debris.  Uterine atony was noted.   Manual fundal massage was performed.  Uterine incision had no extension, was initially closed in 2 layers, 0 Monocryl running locked stitch, 1st layer; 2nd layer was imbricated with 0 Monocryl suture.  It was then noted that there was diffuse bleeding from the incision site itself resulted in the 3rd layer of 0 Monocryl running stitch being placed.  Bleeding was still noted at that point, a request for a DIC panel done.  The CBC was done and some cauterization was also done and warm compress was also placed on the incision and uterus was compressed to extrude any clots within the cavity itself.  Methergine was given as well.  Of note, the patient at the site where the testing for her level of anesthesia showed multiple petechial type redness where she was pinched.  Nonetheless, normal tubes were noted bilaterally.  Polycystic like ovaries were noted bilaterally and a left 2.5 to 3 cm subserosal fibroid posterior to that left tube was noted and a smaller subserosal on the right about a  1 cm was noted posteriorly as  well.  Another figure- of-eight suture was placed for the site of bleeding.  Small bleeding of peritoneum site was performed.  The preoperative platelet from admission was 181,000.  The patient was normotensive.  Comprehensive metabolic panel was also requested as well.  The bleeding seemed to then abated and at that point, Interceed was placed overlying in the inverted T fashion.  The parietal peritoneum was then closed with 2-0 Vicryl.  The rectus fascia was closed with 0 Vicryl x2.  The subcutaneous area was irrigated and good hemostasis was noted and the skin was approximated with Ethicon staples and a pressure dressing  then placed.  SPECIMEN:  Placenta not sent to Pathology.  ESTIMATED BLOOD LOSS:  900 mL.  URINE OUTPUT:  250.  INTRAOPERATIVE FLUID:  About 2500 mL.  The patient tolerated the procedure well, was transferred to recovery room in stable condition.  Her DIC labs are still pending at the time of this dictation.     Maxie BetterSheronette Doyal Saric, M.D.     [place]/MEDQ  D:  06/20/2015  T:  06/21/2015  Job:  [id number]

## 2015-06-21 NOTE — Anesthesia Postprocedure Evaluation (Signed)
Anesthesia Post Note  Patient: Lori Robertson  Procedure(s) Performed: * No procedures listed *  Patient location during evaluation: Mother Baby Anesthesia Type: Epidural Level of consciousness: awake and alert Pain management: pain level controlled Vital Signs Assessment: post-procedure vital signs reviewed and stable Respiratory status: spontaneous breathing Cardiovascular status: stable Postop Assessment: no headache, no backache, epidural receding, patient able to bend at knees and no signs of nausea or vomiting Anesthetic complications: no    Last Vitals:  Filed Vitals:   06/21/15 0515 06/21/15 0520  BP: 99/59 90/59  Pulse: 88 99  Temp:    Resp:      Last Pain:  Filed Vitals:   06/21/15 0542  PainSc: 2                  Edison PaceWILKERSON,Sabella Traore

## 2015-06-21 NOTE — Progress Notes (Signed)
Patient ID: Lori Robertson, female   DOB: August 21, 1980, 34 y.o.   MRN: [id number] Subjective: S/P Primary Cesarean Delivery for Arrest of Descent POD# 1 Information for the patient's newborn:  Daivd CouncilBui, Girl Reyes Ivanhao [[id number]][[id number]]  female  Reports feeling sore and hard to move around. Feeding: breast Patient reports tolerating PO.  Breast symptoms: none Pain controlled with ibuprofen (OTC) and narcotic analgesics including Percocet Denies HA/SOB/C/P/N/V/dizziness. Flatus present. No BM. She reports vaginal bleeding as normal, without clots.  She is ambulating, Foley indwelling, draining clear urine to gravity - RN to remove ASAP.    Objective:   VS:  Filed Vitals:   06/21/15 0323 06/21/15 0508 06/21/15 0515 06/21/15 0520  BP: 107/63 103/54 99/59 90/59   Pulse: 72 75 88 99  Temp: 98.8 F (37.1 C) 98.8 F (37.1 C)    TempSrc:      Resp: 20 18    Height:      Weight:      SpO2: 96% 97%       Intake/Output Summary (Last 24 hours) at 06/21/15 0839 Last data filed at 06/21/15 0032  Gross per 24 hour  Intake   2587 ml  Output   2500 ml  Net     87 ml        Recent Labs  06/20/15 2245 06/21/15 0549  WBC 16.2* 13.5*  HGB 12.3 12.1  HCT 36.3 35.1*  PLT 160  175 166     Blood type: O POS (12/05 1035)  Rubella: Immune (05/03 0000)     Physical Exam:   General: alert, cooperative, fatigued and no distress  CV: Regular rate and rhythm, S1S2 present or without murmur or extra heart sounds  Resp: clear  Abdomen: soft, nontender, normal bowel sounds  Incision: clean, dry, intact and pressure dressing with Tefla dressing underneath (no Honeycomb)  Uterine Fundus: firm, umbilicus even, nontender  Lochia: minimal  Ext: extremities normal, atraumatic, no cyanosis or edema, Homans sign is negative, no sign of DVT and no edema, redness or tenderness in the calves or thighs - SCD hose in place   Assessment/Plan: 34 y.o.   POD# 1.  S/P Cesarean Delivery.  Indications: arrest of descent                 Principal Problem:   Postpartum care following cesarean delivery (12/5) Active Problems:   Full-term premature rupture of membranes  Doing well, stable.               Regular diet as tolerated D/C foley & IV per unit protocol Ambulate Routine post-op care  Kenard GowerAWSON, Birttany Dechellis, M, MSN, CNM 06/21/2015, 8:39 AM

## 2015-06-21 NOTE — Addendum Note (Signed)
Addendum  created 06/21/15 [id number] by Earmon PhoenixValerie P Maxemiliano Riel, CRNA   Modules edited: Clinical Notes   Clinical Notes:  File: [id number]

## 2015-06-21 NOTE — Anesthesia Postprocedure Evaluation (Signed)
Anesthesia Post Note  Patient: Lori Robertson  Procedure(s) Performed: Procedure(s) (LRB): CESAREAN SECTION (N/A)  Patient location during evaluation: Mother Baby Anesthesia Type: Epidural Level of consciousness: awake and alert Pain management: pain level controlled Vital Signs Assessment: post-procedure vital signs reviewed and stable Respiratory status: spontaneous breathing Cardiovascular status: stable Postop Assessment: no headache, no backache, epidural receding, patient able to bend at knees and no signs of nausea or vomiting Anesthetic complications: no    Last Vitals:  Filed Vitals:   06/21/15 0515 06/21/15 0520  BP: 99/59 90/59  Pulse: 88 99  Temp:    Resp:      Last Pain:  Filed Vitals:   06/21/15 0542  PainSc: 2                  Edison PaceWILKERSON,[name]

## 2015-06-21 NOTE — Lactation Note (Signed)
This note was copied from the chart of Lori Suhani Mayr. Lactation Consultation Note  Patient Name: Lori Robertson NFAOZ'HToday's Date: 06/21/2015 Reason for consult: Initial assessment FOB present to interpret for Mom. Baby has been to the breast several times thus far. Baby latched at this visit without difficulty however became shallow during the feeding. Assisted Mom with positioning for baby to sustain good depth with latch. Mom denies any discomfort with BF thus far. FOB very helpful/involved. Basic teaching reviewed with parents. Encouraged to BF with feeding ques, cluster feeding discussed. Lactation brochure left for review, advised of OP services and support group. Encouraged to call for questions/concerns or assist as needed.   Maternal Data Has patient been taught Hand Expression?: Yes Does the patient have breastfeeding experience prior to this delivery?: No  Feeding Feeding Type: Breast Fed Length of feed: 20 min  LATCH Score/Interventions Latch: Grasps breast easily, tongue down, lips flanged, rhythmical sucking.  Audible Swallowing: A few with stimulation  Type of Nipple: Everted at rest and after stimulation  Comfort (Breast/Nipple): Soft / non-tender     Hold (Positioning): Assistance needed to correctly position infant at breast and maintain latch. Intervention(s): Breastfeeding basics reviewed;Support Pillows;Position options;Skin to skin  LATCH Score: 8  Lactation Tools Discussed/Used WIC Program: No   Consult Status Consult Status: Follow-up Date: 06/22/15 Follow-up type: In-patient    Alfred LevinsGranger, Floella Ensz Ann 06/21/2015, 4:18 PM

## 2015-06-21 NOTE — Progress Notes (Signed)
S; Pushing for 2 1/2 hrs  O: pitocin VE  Fully/ +1 station with caput  Tracing: baseline 145 (+) variables Ctx q 2 mins   IMP: arrest of descent ClassA1 GDM P)  Not candidate for vacuum assistance. recommend primary C/S. Husband translates risks. Risk reviewed: infection, bleeding, injury to bladder, bowel, ureters, poss need For blood transfusion with its risk(HIV, hepatitis, acute rxn), internal scar tissue Consent signed

## 2015-06-22 ENCOUNTER — Encounter (HOSPITAL_COMMUNITY): Payer: Self-pay | Admitting: *Deleted

## 2015-06-22 NOTE — Progress Notes (Addendum)
Patient ID: Lori Robertson, female   DOB: [birth year], 34 y.o.   MRN: [id number] TC to Lori Robertson in lab inquiring to see if Von Willebrand Panel had been drawn and what the turn around time for that panel would be.  Lori Robertson informed CNM that blood was drawn 06/21/2015 at 0549, received on evening shift yesterday and will be resulted by Friday 06/24/2015 morning.  Lori Robertson, Lori Robertson, M MSN, CNM 06/22/2015 6:37 AM

## 2015-06-22 NOTE — Progress Notes (Signed)
POSTOPERATIVE DAY # 2 S/P CS  S:         Spouse translating to patient & reports feeling fine - little sore             Tolerating po intake / no nausea / no vomiting / + flatus / no BM             Bleeding is light             Pain controlled with motrin and percocet             Up ad lib / ambulatory/ voiding QS  Newborn breast feeding  / elevated bilirubin - treatment with lights initiated today  O:  VS: BP 96/59 mmHg  Pulse 66  Temp(Src) 98 F (36.7 C) (Oral)  Resp 18  Ht 5\' 1"  (1.549 m)  Wt 54.885 kg (121 lb)  BMI 22.87 kg/m2  SpO2 98%  Breastfeeding? Unknown   LABS:               Recent Labs  06/20/15 2245 06/21/15 0549  WBC 16.2* 13.5*  HGB 12.3 12.1  PLT 160  175 166               Bloodtype: --/--/O POS (12/05 1035)  Rubella: Immune (05/03 0000)                                             I&O: Intake/Output      12/06 0701 - 12/07 0700 12/07 0701 - 12/08 0700   P.O. 840    I.V. (mL/kg) 750 (13.7)    Total Intake(mL/kg) 1590 (29)    Urine (mL/kg/hr) 1250 (0.9)    Blood     Total Output 1250     Net +340          Urine Occurrence 1 x                 Physical Exam:             Alert and Oriented X3  Lungs: Clear and unlabored  Heart: regular rate and rhythm / no mumurs  Abdomen: soft, non-tender, slightly distended, active BS             Fundus: firm, non-tender, U-1             Dressing pressure dressing intact without any drainage visible            Perineum: intact  Lochia: light  Extremities: no edema, no calf pain or tenderness, SCD in place A:         POD # 2 S/P CS             P:        Routine postoperative care              Remove pressure dressing and apply honeycomb             Von Willebrand panel pending             May need to room-in if newborn not stable for DC tomorrow    Marlinda MikeBAILEY, Charlynn Salih CNM, MSN, [facility] 06/22/2015, 10:52 AM

## 2015-06-23 LAB — COAG STUDIES INTERP REPORT: PDF IMAGE: 0

## 2015-06-23 LAB — VON WILLEBRAND PANEL
Coagulation Factor VIII: 196 % — ABNORMAL HIGH (ref 57–163)
Ristocetin Co-factor, Plasma: 225 % — ABNORMAL HIGH (ref 50–200)
Von Willebrand Antigen, Plasma: 290 % — ABNORMAL HIGH (ref 50–200)

## 2015-06-23 MED ORDER — OXYCODONE-ACETAMINOPHEN 5-325 MG PO TABS: 1.0000 | ORAL_TABLET | ORAL | Status: DC | PRN

## 2015-06-23 MED ORDER — IBUPROFEN 600 MG PO TABS: 600.0000 mg | ORAL_TABLET | Freq: Four times a day (QID) | ORAL | Status: AC

## 2015-06-23 NOTE — Lactation Note (Signed)
This note was copied from the chart of Lori Robertson. Lactation Consultation Note  Patient Name: Lori Burtis Juneshao Gautreau EAVWU'JToday's Date: 06/23/2015 Reason for consult: Follow-up assessment   With this first time mom and term baby, now 1158 hours old. Mom was trying to latch the baby, and baby was cuing, searching, but not latching. With a curved tip syringe, I expressed a few drops of aliment um formula onto mom's nipple, and the baby immediately latched, with strong suckles and good breast movement. I reviewed with mom positioning and keeping baby close to breast. We first tried football, and then cross cradle hold. Dad served as Equities traderinterpreter for mom, and mom was pleased to have baby latch deeply. Mom"s milk is just beginning to transfer in, demonstrated with a few drops of transitional milk with hand expression. I was going to set up DEP, but I told parents that as long as baby was latching, and suckling well, a pump was not necessary. Parents will call for help prn. Baby also under double phototherapy when not breastfeeding.    Maternal Data    Feeding Feeding Type: Breast Fed  LATCH Score/Interventions Latch: Repeated attempts needed to sustain latch, nipple held in mouth throughout feeding, stimulation needed to elicit sucking reflex. (curved tip syringe with a few drops od alimentum encouraged baby to latach, with strong suckles) Intervention(s): Adjust position;Assist with latch  Audible Swallowing: A few with stimulation (drops pf transitional milk, colostrum with ahnd expression seen)  Type of Nipple: Everted at rest and after stimulation  Comfort (Breast/Nipple): Soft / non-tender  Problem noted: Filling  Hold (Positioning): Assistance needed to correctly position infant at breast and maintain latch. Intervention(s): Breastfeeding basics reviewed;Support Pillows;Position options;Skin to skin  LATCH Score: 7  Lactation Tools Discussed/Used     Consult Status Consult Status:  Follow-up Date: 06/23/15 Follow-up type: In-patient    Lori Robertson, Lori Robertson 06/23/2015, 9:16 AM

## 2015-06-23 NOTE — Progress Notes (Signed)
POD # 3  Subjective: Pt reports feeling ok/ Pain controlled with Motrin and Percocet Tolerating po/Voiding without problems/ No n/v/ Flatus present, +BM Activity: ad lib Bleeding is light Newborn info:  Information for the patient's newborn:  Lori Robertson, Lori Robertson [[id number]][[id number]]  female breastfeeding/ juandice/ phototherapy  Objective: VS: VS:  Filed Vitals:   06/21/15 2148 06/22/15 0100 06/22/15 0515 06/23/15 0559  BP: 95/61 93/55 96/59  100/64  Pulse: 80 73 66 74  Temp: 98.2 F (36.8 C) 97.3 F (36.3 C) 98 F (36.7 C) 97.9 F (36.6 C)  TempSrc: Oral Oral Oral Oral  Resp: 19 16 18 17   Height:      Weight:      SpO2: 98% 97% 98%     I&O: Intake/Output      12/07 0701 - 12/08 0700 12/08 0701 - 12/09 0700   P.O.     I.V. (mL/kg)     Total Intake(mL/kg)     Urine (mL/kg/hr)     Total Output       Net              LABS:  Recent Labs  06/20/15 2245 06/21/15 0549  WBC 16.2* 13.5*  HGB 12.3 12.1  PLT 160  175 166   Blood type: --/--/O POS (12/05 1035) Rubella: Immune (05/03 0000)           Physical Exam:  General: alert, cooperative and no distress CV: Regular rate and rhythm Resp: CTA bilaterally Abdomen: soft, nontender, normal bowel sounds Incision: Covered with Tegaderm and honeycomb dressing; no significant drainage, edema, bruising, or erythema; well approximated with staples Uterine Fundus: firm, below umbilicus, nontender Lochia: minimal Ext: extremities normal, atraumatic, no cyanosis or edema and Homans sign is negative, no sign of DVT   Assessment: POD # 3/ G1P1001/ S/P C/Section d/t arrest of descent  A1GDM, delivered Doing well and stable for discharge home  Plan: Discharge home RX's: Ibuprofen 600mg  po Q 6 hrs prn pain #30 Refill x 1 Percocet 5/325 1 - 2 tabs po every 4 hrs prn pain #30 Refill x 0 [facility] given    Signed: Donette LarryBHAMBRI, Yaasir Menken, Dorris CarnesN, MSN, CNM 06/23/2015, 9:20 AM

## 2015-06-23 NOTE — Discharge Summary (Signed)
DISCHARGE SUMMARY:  Patient ID: Lori Robertson MRN: [id number] DOB/AGE: 1981/05/18 34 y.o.  Admit date: 06/20/2015 Admission Diagnoses: 39 weeks, SROM   Discharge date: 06/23/2015 Discharge Diagnoses: S/P C/S on 06/20/15        Prenatal history: G1P1001   EDC: 06/27/2015, Alternate EDD Entry  [facility] [redacted] wks gestation. Primary provider: Dr. Juliene PinaMody Prenatal course complicated by A1GDM, first trimester bleeding  Prenatal labs: ABO, Rh: --/--/O POS (12/05 1035)  Antibody: NEG (12/05 1035) Rubella: Immune RPR: Non Reactive (12/05 1036)  HBsAg: Negative (05/03 0000)  HIV: Non-reactive (05/03 0000)  GBS: Negative (11/15 0000)  GTT: 140, 3hr abnml  Medical / Surgical History :  Past medical history:  Past Medical History  Diagnosis Date  . Gestational diabetes     diet controlled    Past surgical history:  Past Surgical History  Procedure Laterality Date  . No past surgeries    . Cesarean section N/A 06/20/2015    Procedure: CESAREAN SECTION;  Surgeon: Maxie BetterSheronette Cousins, MD;  Location: [facility]S;  Service: Obstetrics;  Laterality: N/A;     Medications on Admission: Prescriptions prior to admission  Medication Sig Dispense Refill Last Dose  . Prenatal Multivit-Min-Fe-FA (PRENATAL VITAMINS PO) Take by mouth.   06/19/2015 at Unknown time    Allergies: Review of patient's allergies indicates no known allergies.   Intrapartum Course:  Admitted for SROM, epidural, Pitocin, active pushing x2.5 hrs, arrest of descent, PCS.     Postpartum Course: Uncomplicated. Discharged on POD#3.  Physical Exam:   VSS: Blood pressure 100/64, pulse 74, temperature 97.9 F (36.6 C), temperature source Oral, resp. rate 17, height 5\' 1"  (1.549 m), weight 54.885 kg (121 lb), SpO2 98 %, unknown if currently breastfeeding.  LABS:  Recent Labs  06/20/15 2245 06/21/15 0549  WBC 16.2* 13.5*  HGB 12.3 12.1  PLT 160  175 166    General: alert and  oriented x3 Heart: RRR Lungs: CTA bilaterally GI: soft, non-tender, non-distended, BS x4 Lochia: small Uterus: firm below umbilicus Incision: well approximated with staples; honeycomb dressing-no significant erythema, drainage, or edema Extremities: No edema, Homans neg   Newborn Data Live born female  Birth Weight: 7 lb 8.8 oz (3425 g) APGAR: 8, 9  See operative report for further details   Discharge Instructions:  Wound Care: keep clean and dry  Postpartum Instructions: [facility] discharge booklet - instructions reviewed Medications:    Medication List    TAKE these medications        ibuprofen 600 MG tablet  Commonly known as:  ADVIL,MOTRIN  Take 1 tablet (600 mg total) by mouth every 6 (six) hours.     oxyCODONE-acetaminophen 5-325 MG tablet  Commonly known as:  PERCOCET/ROXICET  Take 1-2 tablets by mouth every 4 (four) hours as needed (for pain scale greater than 7).     PRENATAL VITAMINS PO  Take by mouth.            Follow-up Information    Follow up with MODY,VAISHALI R, MD. Schedule an appointment as soon as possible for a visit in 4 days.   Specialty:  Obstetrics and Gynecology   Why:  staple removal with RN   Contact information:   [address] [place] [id number] [phone]       Follow up with MODY,VAISHALI R, MD. Schedule an appointment as soon as possible for a visit in 6 weeks.   Specialty:  Obstetrics and Gynecology   Contact information:  [address] [place] [address] [phone]         Signed: Donette Larry, Dorris Carnes MSN, CNM 06/23/2015, 9:56 AM

## 2015-06-24 LAB — TYPE AND SCREEN
ABO/RH(D): O POS
ANTIBODY SCREEN: NEGATIVE
UNIT DIVISION: 0
UNIT DIVISION: 0
UNIT DIVISION: 0
Unit division: 0

## 2017-10-29 ENCOUNTER — Ambulatory Visit: Payer: Self-pay | Admitting: *Deleted

## 2017-10-29 ENCOUNTER — Other Ambulatory Visit: Payer: Self-pay

## 2017-10-29 ENCOUNTER — Emergency Department (HOSPITAL_BASED_OUTPATIENT_CLINIC_OR_DEPARTMENT_OTHER)
Admission: EM | Admit: 2017-10-29 | Discharge: 2017-10-29 | Disposition: A | Discharge status: Home / Self Care | Payer: BLUE CROSS/BLUE SHIELD | Attending: Emergency Medicine | Admitting: Emergency Medicine

## 2017-10-29 ENCOUNTER — Encounter (HOSPITAL_BASED_OUTPATIENT_CLINIC_OR_DEPARTMENT_OTHER): Payer: Self-pay | Admitting: *Deleted

## 2017-10-29 ENCOUNTER — Encounter: Payer: Self-pay | Admitting: Neurology

## 2017-10-29 DIAGNOSIS — R200 Anesthesia of skin: Secondary | ICD-10-CM | POA: Diagnosis not present

## 2017-10-29 DIAGNOSIS — Z79899 Other long term (current) drug therapy: Secondary | ICD-10-CM | POA: Diagnosis not present

## 2017-10-29 DIAGNOSIS — R202 Paresthesia of skin: Secondary | ICD-10-CM

## 2017-10-29 NOTE — ED Triage Notes (Addendum)
Pt c/o  extr numbness  Off anf on x 2 weeks, seen by UC x 2 with labs done. Also [facility]

## 2017-10-29 NOTE — Telephone Encounter (Addendum)
Pt's husband, Lori Robertson, called stating that the pt she is having progressive numbness which started 2 weeks ago;initially it was in her toes progressing to her feet to legs and this morning when she got up she was having numbness in her fingers; he says that the symptoms go away after the pt has been up for a couple of hours; he states that she has been [facility]; her husband also says that she fell forward in the shower 5 days ago and is having bilateral knee pain; recommedations made per nurse triage to include going to the ED; the pt's husband verbalizes understanding and they will proceed there; they would also like to establish care with a provider; a new pt appointment is offered and accepted with [name], [facility], 10/30/17 at 1430; he verbalizes understanding; will route to office regarding this upcoming appointment; spoke with [name].     Reason for Disposition . [1] Numbness (i.e., loss of sensation) of the face, arm / hand, or leg / foot on one side of the body AND [2] sudden onset AND [3] brief (now gone)  Answer Assessment - Initial Assessment Questions 1. SYMPTOM: "What is the main symptom you are concerned about?" (e.g., weakness, numbness)     numbness 2. ONSET: "When did this start?" (minutes, hours, days; while sleeping)    2 weeks ago 3. LAST NORMAL: "When was the last time you were normal (no symptoms)?"     10/24/17 4. PATTERN "Does this come and go, or has it been constant since it started?"  "Is it present now?"     Every morning but goes away in 2 hours 5. CARDIAC SYMPTOMS: "Have you had any of the following symptoms: chest pain, difficulty breathing, palpitations?"     no 6. NEUROLOGIC SYMPTOMS: "Have you had any of the following symptoms: headache, dizziness, vision loss, double vision, changes in speech, unsteady on your feet?"     Legs feel heavy when she feels numbness 7. OTHER SYMPTOMS: "Do you have any other symptoms?"     Larey SeatFell  forward on shower 5 days ago, and she is having bilateral knee pain and back pain  8. PREGNANCY: "Is there any chance you are pregnant?" "When was your last menstrual period?"     No 10/07/17  Protocols used: NEUROLOGIC DEFICIT-A-AH

## 2017-10-29 NOTE — ED Provider Notes (Addendum)
[facility] EMERGENCY DEPARTMENT Provider Note   CSN: [id number] Arrival date & time: 10/29/17  1241     History   Chief Complaint Chief Complaint  Patient presents with  . Numbness    HPI Lori Robertson is a 37 y.o. female.  Patient with no significant past medical history presents to the emergency today with acute onset of progressive bilateral paresthesias.  The symptoms started about 2 weeks ago.  Patient reports having numbness and tingling in the bottoms of her feet bilaterally and also her calves.  Symptoms occur in the mornings and in the evenings and go away during the day.  She has not had any difficulty walking or weakness in her legs but states that they sometimes feel heavy.  She saw outside urgent care for this and has a PCP appt set up for next month.  Today she had some transient numbness in her fingers, again without weakness.  This has resolved and currently she is asymptomatic.  Patient denies any headaches, vision changes, difficulty with speech.  She has no neck pain or pain which radiates into her upper or lower extremities.  Husband reports a fall 3 days ago, after the onset of her lower extremity numbness or tingling.  She had x-rays of her back performed and some basic lab work which was negative.  No treatments prior to arrival.  She was prescribed Flexeril for her lower back after falling, his pain is improved.  She currently has some midline spine pain 2/2 fall.   Husband interprets for wife and gives majority of history.      Past Medical History:  Diagnosis Date  . Gestational diabetes    diet controlled    Patient Active Problem List   Diagnosis Date Noted  . Full-term premature rupture of membranes 06/20/2015  . Postpartum care following cesarean delivery (12/5) 06/20/2015    Past Surgical History:  Procedure Laterality Date  . CESAREAN SECTION N/A 06/20/2015   Procedure: CESAREAN SECTION;  Surgeon: Maxie Better, MD;  Location: [facility];  Service: Obstetrics;  Laterality: N/A;  . NO PAST SURGERIES       OB History    Gravida  1   Para  1   Term  1   Preterm      AB      Living  1     SAB      TAB      Ectopic      Multiple  0   Live Births  1            Home Medications    Prior to Admission medications   Medication Sig Start Date End Date Taking? Authorizing Provider  cyclobenzaprine (FLEXERIL) 5 MG tablet Take 5 mg by mouth 3 (three) times daily as needed for muscle spasms.   Yes [provider]  ibuprofen (ADVIL,MOTRIN) 600 MG tablet Take 1 tablet (600 mg total) by mouth every 6 (six) hours. 06/23/15   Donette Larry, CNM  Prenatal Multivit-Min-Fe-FA (PRENATAL VITAMINS PO) Take by mouth.    [provider]    Family History History reviewed. No pertinent family history.  Social History Social History   Tobacco Use  . Smoking status: Never Smoker  . Smokeless tobacco: Never Used  Substance Use Topics  . Alcohol use: No  . Drug use: No     Allergies   Patient has no known allergies.   Review of Systems Review of Systems  Constitutional: Negative for  fever.  HENT: Negative for congestion, dental problem, rhinorrhea and sinus pressure.   Eyes: Negative for photophobia, discharge, redness and visual disturbance.  Respiratory: Negative for shortness of breath.   Cardiovascular: Negative for chest pain.  Gastrointestinal: Negative for nausea and vomiting.  Musculoskeletal: Positive for back pain. Negative for gait problem, neck pain and neck stiffness.  Skin: Negative for rash.  Neurological: Positive for numbness. Negative for syncope, speech difficulty, weakness, light-headedness and headaches.  Psychiatric/Behavioral: Negative for confusion.     Physical Exam Updated Vital Signs BP 121/77   Pulse (!) 105   Temp 98.4 F (36.9 C) (Oral)   Resp 16   Ht 5\' 2"  (1.575 m)   Wt 42.2 kg (93 lb)   LMP 10/07/2017   SpO2 100%   BMI 17.01 kg/m    Physical Exam  Constitutional: She is oriented to person, place, and time. She appears well-developed and well-nourished.  HENT:  Head: Normocephalic and atraumatic.  Right Ear: Tympanic membrane, external ear and ear canal normal.  Left Ear: Tympanic membrane, external ear and ear canal normal.  Nose: Nose normal.  Mouth/Throat: Uvula is midline, oropharynx is clear and moist and mucous membranes are normal.  Eyes: Pupils are equal, round, and reactive to light. Conjunctivae, EOM and lids are normal. Right eye exhibits no nystagmus. Left eye exhibits no nystagmus.  Neck: Normal range of motion. Neck supple.  Cardiovascular: Normal rate and regular rhythm.  Pulmonary/Chest: Effort normal and breath sounds normal.  Abdominal: Soft. There is no tenderness.  Musculoskeletal:       Cervical back: She exhibits normal range of motion, no tenderness and no bony tenderness.       Thoracic back: She exhibits tenderness (mid t-spine). She exhibits normal range of motion and no bony tenderness.       Lumbar back: She exhibits normal range of motion, no tenderness and no bony tenderness.  Neurological: She is alert and oriented to person, place, and time. She has normal strength and normal reflexes. No cranial nerve deficit or sensory deficit. She displays a negative Romberg sign. Coordination and gait normal. GCS eye subscore is 4. GCS verbal subscore is 5. GCS motor subscore is 6.  Reflex Scores:      Tricep reflexes are 2+ on the right side and 2+ on the left side.      Patellar reflexes are 2+ on the right side and 2+ on the left side. Normal distal sensation.  5 out of 5 strength in bilateral upper and lower extremities.  Skin: Skin is warm and dry.  Psychiatric: She has a normal mood and affect.  Nursing note and vitals reviewed.    ED Treatments / Results  Labs (all labs ordered are listed, but only abnormal results are displayed) Labs Reviewed - No data to  display  EKG None  Radiology No results found.  Procedures Procedures (including critical care time)  Medications Ordered in ED Medications - No data to display   Initial Impression / Assessment and Plan / ED Course  I have reviewed the triage vital signs and the nursing notes.  Pertinent labs & imaging results that were available during my care of the patient were reviewed by me and considered in my medical decision making (see chart for details).     Patient seen and examined. Discussed plan of neuro f/u with Dr. Fayrene Fearing who agrees.  Ambulatory referral ureteral to neurology placed.  Patient told to expect a call back.  Encourage PCP follow-up  as planned.  We discussed need to return to the emergency department with any abruptly worsening symptoms especially if weakness develops.  Husband verbalizes understanding and agrees with the plan.  Vital signs reviewed and are as follows: BP 121/77   Pulse (!) 105   Temp 98.4 F (36.9 C) (Oral)   Resp 16   Ht 5\' 2"  (1.575 m)   Wt 42.2 kg (93 lb)   LMP 10/07/2017   SpO2 100%   BMI 17.01 kg/m    Final Clinical Impressions(s) / ED Diagnoses   Final diagnoses:  Paresthesias   Patient with bilateral lower extremity paresthesias involving upper extremities today.  No reported weakness.  Symptoms are transient and currently resolved.  She has a completely normal neuro exam here.  No emergent conditions suspected, however patient will require close neurology follow-up.  At this time, given her current exam, I feel comfortable with her doing this as an outpatient.  Patient and her husband seem reliable to return if symptoms abruptly worsen and we discussed this at length.  Neurology referral is made.   ED Discharge Orders        Ordered    Ambulatory referral to Neurology    Comments:  An appointment is requested in approximately: 1 week  New onset, bilateral, transient paresthesias.   10/29/17 1457    Ambulatory referral to  Neurology    Comments:  An appointment is requested in approximately: 1 week  New onset, bilateral, transient paresthesias.   10/29/17 1457       Renne CriglerGeiple, Naija Troost, PA-C 10/29/17 1510    Renne CriglerGeiple, Ketrina Boateng, PA-C 10/29/17 1513    Rolland PorterJames, Mark, MD 11/01/17 2022

## 2017-10-29 NOTE — Discharge Instructions (Signed)
Please read and follow all provided instructions.  Your diagnoses today include:  1. Paresthesias    Tests performed today include:  Vital signs. See below for your results today.   Medications prescribed:   None  Take any prescribed medications only as directed.  Home care instructions:  Follow any educational materials contained in this packet.  BE VERY CAREFUL not to take multiple medicines containing Tylenol (also called acetaminophen). Doing so can lead to an overdose which can damage your liver and cause liver failure and possibly death.   Follow-up instructions: Please follow-up with your primary care provider as planned and see either [facility] or [facility] when able.    Return instructions:   Please return to the Emergency Department if you experience worsening symptoms.   Return if you have weakness in your arms or legs, slurred speech, trouble walking or talking, confusion, or trouble with your balance.   Please return if you have any other emergent concerns.  Additional Information:  Your vital signs today were: BP 121/77    Pulse (!) 105    Temp 98.4 F (36.9 C) (Oral)    Resp 16    Ht 5\' 2"  (1.575 m)    Wt 42.2 kg (93 lb)    LMP 10/07/2017    SpO2 100%    BMI 17.01 kg/m  If your blood pressure (BP) was elevated above 135/85 this visit, please have this repeated by your doctor within one month. --------------

## 2017-10-29 NOTE — Telephone Encounter (Signed)
FYI to Edward.  

## 2017-10-30 ENCOUNTER — Ambulatory Visit: Payer: BLUE CROSS/BLUE SHIELD | Admitting: Medical

## 2017-10-30 DIAGNOSIS — Z0289 Encounter for other administrative examinations: Secondary | ICD-10-CM

## 2017-12-06 ENCOUNTER — Encounter: Payer: Self-pay | Admitting: Neurology

## 2017-12-06 ENCOUNTER — Ambulatory Visit: Payer: BLUE CROSS/BLUE SHIELD | Admitting: Neurology

## 2017-12-06 VITALS — BP 90/64 | HR 96 | Ht 62.0 in | Wt 92.2 lb

## 2017-12-06 DIAGNOSIS — R202 Paresthesia of skin: Secondary | ICD-10-CM

## 2017-12-06 NOTE — Progress Notes (Signed)
[facility] Neurology Division Clinic Note - Initial Visit   Date: 12/06/17  Lori Robertson MRN: [id number] DOB: December 21, 1980   Dear Jomarie Longs, PA-C:   Thank you for your kind referral of Lori Robertson for consultation of bilateral leg numbness. Although her history is well known to you, please allow [place] to reiterate it for the purpose of our medical record. The patient was accompanied to the clinic by husband and daughter who also provides collateral information.     History of Present Illness: Lori Robertson is a 37 y.o. right-handed  [place] female presenting for evaluation of bilateral feet numbness.  Patient refused to use medical language interpretor and signed a waiver of right to free interpretor services, declining their services and opting to choose Lori Robertson (husband).    Starting around early April, she began having numbness of both feet and has gradually involved the lower legs, up to the level of the knees.  Symptoms start in the morning and resolve within a few hours. She has sensation of heaviness of the legs, as if she has icy hot ointment on the legs.  She denies any weakness, imbalance, or falls.  She also complains of numbness of both hands.  She has not had any prior neurological symptoms like this before.  No recent travel or illness.     Out-side paper records, electronic medical record, and images have been reviewed where available and summarized as:  Labs 10/21/2017:  Vitamin B12 369, TSH 1.342   Past Medical History:  Diagnosis Date  . Gestational diabetes    diet controlled    Past Surgical History:  Procedure Laterality Date  . CESAREAN SECTION N/A 06/20/2015   Procedure: CESAREAN SECTION;  Surgeon: Maxie Better, MD;  Location: [facility];  Service: Obstetrics;  Laterality: N/A;  . NO PAST SURGERIES       Medications:  Outpatient Encounter Medications as of 12/06/2017  Medication Sig  . cyclobenzaprine (FLEXERIL) 5 MG tablet Take 5 mg by mouth 3 (three)  times daily as needed for muscle spasms.  Marland Kitchen ibuprofen (ADVIL,MOTRIN) 600 MG tablet Take 1 tablet (600 mg total) by mouth every 6 (six) hours.  . Prenatal Multivit-Min-Fe-FA (PRENATAL VITAMINS PO) Take by mouth.   No facility-administered encounter medications on file as of 12/06/2017.      Allergies: No Known Allergies  Family History: Family History  Problem Relation Age of Onset  . Healthy Mother   . Healthy Father   . Stroke Neg Hx   . Heart disease Neg Hx   . Diabetes Neg Hx     Social History: Social History   Tobacco Use  . Smoking status: Never Smoker  . Smokeless tobacco: Never Used  Substance Use Topics  . Alcohol use: No  . Drug use: No   Social History   Social History Narrative   She is a stay-at-home mother.  They have one child.  Education: college.    Review of Systems:  CONSTITUTIONAL: No fevers, chills, night sweats, or weight loss.   EYES: No visual changes or eye pain ENT: No hearing changes.  No history of nose bleeds.   RESPIRATORY: No cough, wheezing and shortness of breath.   CARDIOVASCULAR: Negative for chest pain, and palpitations.   GI: Negative for abdominal discomfort, blood in stools or black stools.  No recent change in bowel habits.   GU:  No history of incontinence.   MUSCLOSKELETAL: No history of joint pain or swelling.  No myalgias.   SKIN:  Negative for lesions, rash, and itching.   HEMATOLOGY/ONCOLOGY: Negative for prolonged bleeding, bruising easily, and swollen nodes.  No history of cancer.   ENDOCRINE: Negative for cold or heat intolerance, polydipsia or goiter.   PSYCH:  No depression or anxiety symptoms.   NEURO: As Above.   Vital Signs:  BP 90/64   Pulse 96   Ht  (1.575 m)   Wt 92 lb 4 oz (41.8 kg)   SpO2 97%   BMI 16.87 kg/m  Pain Scale: 0 on a scale of 0-10   General Medical Exam:   General:  Well appearing, comfortable.   Eyes/ENT: see cranial nerve examination.   Neck: No masses appreciated.  Full range  of motion without tenderness.  No carotid bruits. Respiratory:  Clear to auscultation, good air entry bilaterally.   Cardiac:  Regular rate and rhythm, no murmur.   Extremities:  No deformities, edema, or skin discoloration.  Skin:  No rashes or lesions.  Neurological Exam: MENTAL STATUS including orientation to time, place, person, recent and remote memory, attention span and concentration, language, and fund of knowledge is normal.  Speech is not dysarthric.  CRANIAL NERVES: II:  No visual field defects.  Unremarkable fundi.   III-IV-VI: Pupils equal round and reactive to light.  Normal conjugate, extra-ocular eye movements in all directions of gaze.  No nystagmus.  No ptosis.   V:  Normal facial sensation.     VII:  Normal facial symmetry and movements.  VIII:  Normal hearing and vestibular function.   IX-X:  Normal palatal movement.   XI:  Normal shoulder shrug and head rotation.   XII:  Normal tongue strength and range of motion, no deviation or fasciculation.  MOTOR:  No atrophy, fasciculations or abnormal movements.  No pronator drift.  Tone is normal.    Right Upper Extremity:    Left Upper Extremity:    Deltoid  5/5   Deltoid  5/5   Biceps  5/5   Biceps  5/5   Triceps  5/5   Triceps  5/5   Wrist extensors  5/5   Wrist extensors  5/5   Wrist flexors  5/5   Wrist flexors  5/5   Finger extensors  5/5   Finger extensors  5/5   Finger flexors  5/5   Finger flexors  5/5   Dorsal interossei  5/5   Dorsal interossei  5/5   Abductor pollicis  5/5   Abductor pollicis  5/5   Tone (Ashworth scale)  0  Tone (Ashworth scale)  0   Right Lower Extremity:    Left Lower Extremity:    Hip flexors  5/5   Hip flexors  5/5   Hip extensors  5/5   Hip extensors  5/5   Knee flexors  5/5   Knee flexors  5/5   Knee extensors  5/5   Knee extensors  5/5   Dorsiflexors  5/5   Dorsiflexors  5/5   Plantarflexors  5/5   Plantarflexors  5/5   Toe extensors  5/5   Toe extensors  5/5   Toe flexors  5/5    Toe flexors  5/5   Tone (Ashworth scale)  0  Tone (Ashworth scale)  0   MSRs:  Right  Left brachioradialis 2+  brachioradialis 2+  biceps 2+  biceps 2+  triceps 2+  triceps 2+  patellar 2+  patellar 2+  ankle jerk 2+  ankle jerk 2+  Hoffman no  Hoffman no  plantar response down  plantar response down   SENSORY:  Normal and symmetric perception of light touch, pinprick, vibration, and proprioception.  Romberg's sign absent.   COORDINATION/GAIT: Normal finger-to- nose-finger and heel-to-shin.  Intact rapid alternating movements bilaterally.  Able to rise from a chair without using arms.  Gait narrow based and stable. Tandem and stressed gait intact.    IMPRESSION: Bilateral leg and hand numbness. Symptoms seem to only occur in the morning and self-resolve.  Exam is non-focal with normal sensation, reflexes, and strength.  With normal exam and fleeting symptoms, likelihood of anything worrisome is low. I will proceed with NCS/EMG of the right arm and leg to assess for neuropathy, but overall suspicion is low.  Her TSH and vitamin B12 is normal.  If EDX is normal and she continues to have symptoms, MRI lumbar spine will be the next step.    Thank you for allowing me to participate in patient's care.  If I can answer any additional questions, I would be pleased to do so.    Sincerely,    Donika K. Allena Katz, DO

## 2017-12-06 NOTE — Patient Instructions (Addendum)
NCS/EMG of the right arm and leg  We will call you with the results 

## 2017-12-10 ENCOUNTER — Ambulatory Visit (INDEPENDENT_AMBULATORY_CARE_PROVIDER_SITE_OTHER): Payer: BLUE CROSS/BLUE SHIELD | Admitting: Neurology

## 2017-12-10 DIAGNOSIS — R202 Paresthesia of skin: Secondary | ICD-10-CM

## 2017-12-10 NOTE — Procedures (Signed)
[facility] Neurology  [address] [place], [address]  [place], [place] [address] Tel: [phone] Fax:  [phone] Test Date:  12/10/2017  Patient: Lori Robertson DOB: 02-02-1981 Physician: Nita Sickle, DO  Sex: Female Height:  Ref Phys: Nita Sickle, DO  ID#: [id number] Temp: 33.0C Technician:    Patient Complaints: This is a 37 year-old female referred for evaluation of generalized numbness.  NCV & EMG Findings: Extensive electrodiagnostic testing of the right upper and lower extremity shows:  1. All sensory responses including the right median, ulnar, mixed palmer, peroneal, and sural nerves are within normal limits. 2. All motor responses including the right median, ulnar, peroneal, and tibial nerves are within normal limits. 3. Right tibial H reflex study is within normal limits. 4. There is no evidence of active or chronic motor axon loss changes affecting any of the tested muscles. Motor unit configuration and recruitment pattern is within normal limits.  Impression: This is a normal study of the right side.   In particular, there is no evidence of a sensorimotor polyneuropathy or cervical/lumbosacral radiculopathy.   ___________________________ Nita Sickle, DO    Nerve Conduction Studies Anti Sensory Summary Table   Stim Site NR Peak (ms) Norm Peak (ms) P-T Amp (V) Norm P-T Amp  Right Median Anti Sensory (2nd Digit)  Wrist    2.3 <3.4 81.9 >20  Right Sup Peroneal Anti Sensory (Ant Lat Mall)  12 cm    2.1 <4.5 29.4 >5  Right Sural Anti Sensory (Lat Mall)  Calf    2.7 <4.5 46.3 >5  Right Ulnar Anti Sensory (5th Digit)  Wrist    2.2 <3.1 73.1 >12   Motor Summary Table   Stim Site NR Onset (ms) Norm Onset (ms) O-P Amp (mV) Norm O-P Amp Site1 Site2 Delta-0 (ms) Dist (cm) Vel (m/s) Norm Vel (m/s)  Right Median Motor (Abd Poll Brev)  Wrist    2.1 <3.9 9.0 >6 Elbow Wrist 4.4 27.5 62 >50  Elbow    6.5  8.8         Right Peroneal Motor (Ext Dig Brev)  Ankle    3.2  <5.5 5.0 >3 B Fib Ankle 5.9 35.0 59 >40  B Fib    9.1  5.0  Poplt B Fib 1.2 7.0 58 >40  Poplt    10.3  5.0         Right Tibial Motor (Abd Hall Brev)  Ankle    3.6 <6.0 19.4 >8 Knee Ankle 6.9 37.0 54 >40  Knee    10.5  16.6         Right Ulnar Motor (Abd Dig Minimi)  Wrist    1.9 <3.1 12.2 >7 B Elbow Wrist 3.3 21.0 64 >50  B Elbow    5.2  12.2  A Elbow B Elbow 1.7 10.0 59 >50  A Elbow    6.9  11.7          Comparison Summary Table   Stim Site NR Peak (ms) Norm Peak (ms) P-T Amp (V) Site1 Site2 Delta-P (ms) Norm Delta (ms)  Right Median/Ulnar Palm Comparison (Wrist - 8cm)  Median Palm    1.6 <2.2 54.4 Median Palm Ulnar Palm 0.2   Ulnar Palm    1.4 <2.2 39.8       H Reflex Studies   NR H-Lat (ms) Lat Norm (ms) L-R H-Lat (ms)  Right Tibial (Gastroc)     26.26 <35    EMG   Side Muscle Ins  Act Fibs Psw Fasc Number Recrt Dur Dur. Amp Amp. Poly Poly. Comment  Right AntTibialis Nml Nml Nml Nml Nml Nml Nml Nml Nml Nml Nml Nml N/A  Right Gastroc Nml Nml Nml Nml Nml Nml Nml Nml Nml Nml Nml Nml N/A  Right Flex Dig Long Nml Nml Nml Nml Nml Nml Nml Nml Nml Nml Nml Nml N/A  Right RectFemoris Nml Nml Nml Nml Nml Nml Nml Nml Nml Nml Nml Nml N/A  Right GluteusMed Nml Nml Nml Nml Nml Nml Nml Nml Nml Nml Nml Nml N/A  Right 1stDorInt Nml Nml Nml Nml Nml Nml Nml Nml Nml Nml Nml Nml N/A  Right Ext Indicis Nml Nml Nml Nml Nml Nml Nml Nml Nml Nml Nml Nml N/A  Right PronatorTeres Nml Nml Nml Nml Nml Nml Nml Nml Nml Nml Nml Nml N/A  Right Biceps Nml Nml Nml Nml Nml Nml Nml Nml Nml Nml Nml Nml N/A  Right Triceps Nml Nml Nml Nml Nml Nml Nml Nml Nml Nml Nml Nml N/A  Right Deltoid Nml Nml Nml Nml Nml Nml Nml Nml Nml Nml Nml Nml N/A      Waveforms:

## 2017-12-11 ENCOUNTER — Telehealth: Payer: Self-pay | Admitting: *Deleted

## 2017-12-11 NOTE — Telephone Encounter (Signed)
Patient's husband given results and will continue to monitor.

## 2017-12-11 NOTE — Telephone Encounter (Signed)
-----   Message from Glendale Chard, DO sent at 12/10/2017  4:12 PM EDT ----- Please inform patient's husband that her nerve testing is normal.  Recommend monitoring symptoms and if they become constant, we can do additional testing (MRI lumbar spine), but at this point, continue to observe.

## 2018-04-15 ENCOUNTER — Other Ambulatory Visit: Payer: Self-pay | Admitting: Gastroenterology

## 2018-04-15 DIAGNOSIS — R1310 Dysphagia, unspecified: Secondary | ICD-10-CM

## 2018-04-21 ENCOUNTER — Ambulatory Visit
Admission: RE | Admit: 2018-04-21 | Discharge: 2018-04-21 | Disposition: A | Discharge status: Home / Self Care | Payer: BLUE CROSS/BLUE SHIELD | Source: Ambulatory Visit | Attending: Gastroenterology | Admitting: Gastroenterology

## 2018-04-21 DIAGNOSIS — R1310 Dysphagia, unspecified: Secondary | ICD-10-CM

## 2019-10-30 IMAGING — RF DG ESOPHAGUS
7 of 8 series · 14 of 22 positions shown · non-contrast
Comparison: None.

CLINICAL DATA: Dysphagia, globus sensation

EXAM:
ESOPHOGRAM / BARIUM SWALLOW / BARIUM TABLET STUDY
TECHNIQUE: Combined double contrast and single contrast examination performed
using effervescent crystals, thick barium liquid, and thin barium
liquid. The patient was observed with fluoroscopy swallowing a 13 mm
barium sulphate tablet.
FLUOROSCOPY TIME:  Fluoroscopy Time:  48 seconds
Radiation Exposure Index (if provided by the fluoroscopic device):
62 mGy
Number of Acquired Spot Images: 0

[Series 1: sequence · 0.28mm/px · 2 of 19 frames shown (1 of 6)]
[frame 3/19]
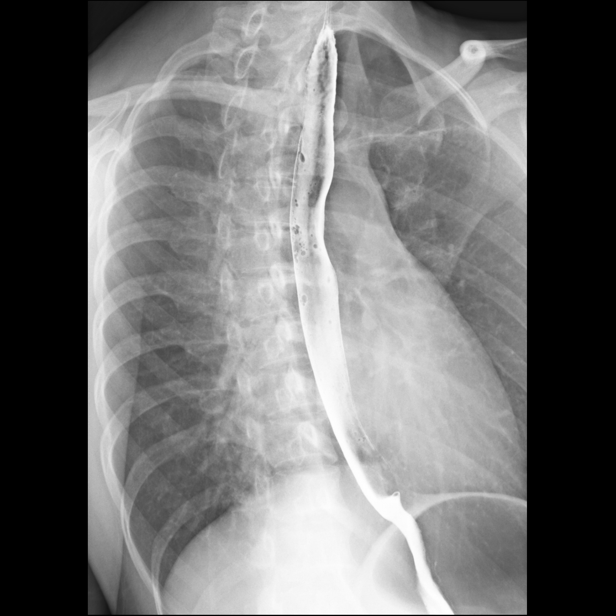
[frame 17/19]
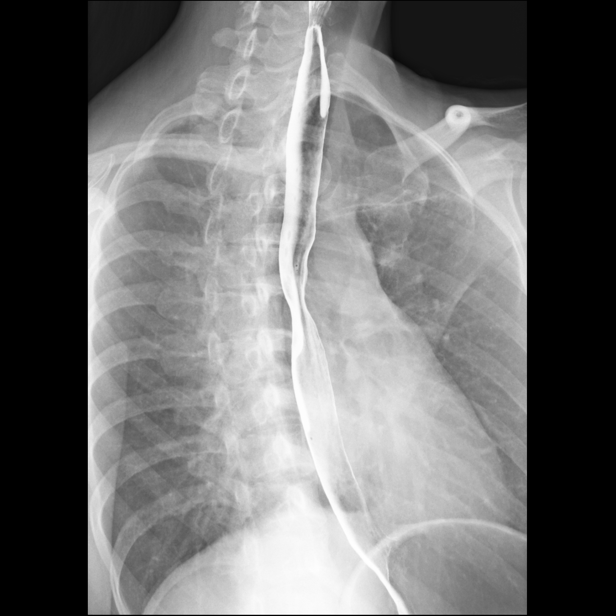

[Series 2: sequence · 0.28mm/px · 2 of 28 frames shown (2 of 6)]
[frame 5/28]
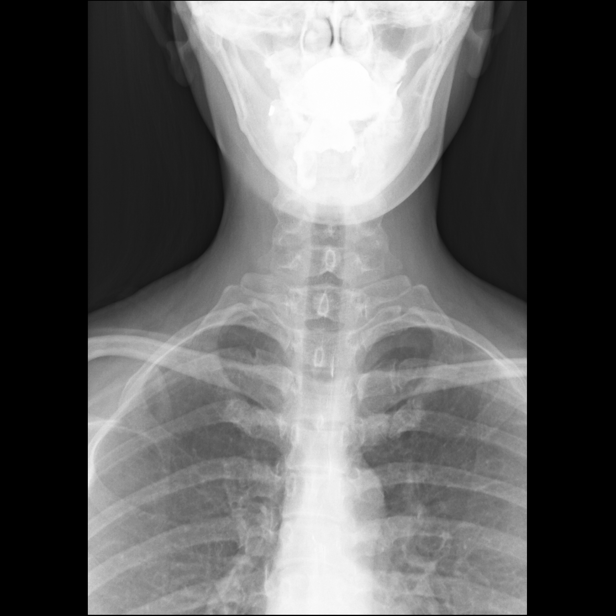
[frame 19/28]
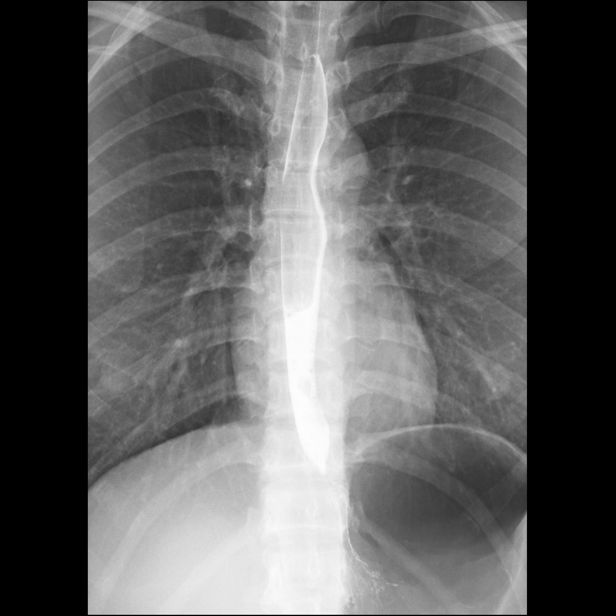

[Series 3: sequence · 0.28mm/px · 3 of 28 frames shown (3 of 6)]
[frame 5/28]
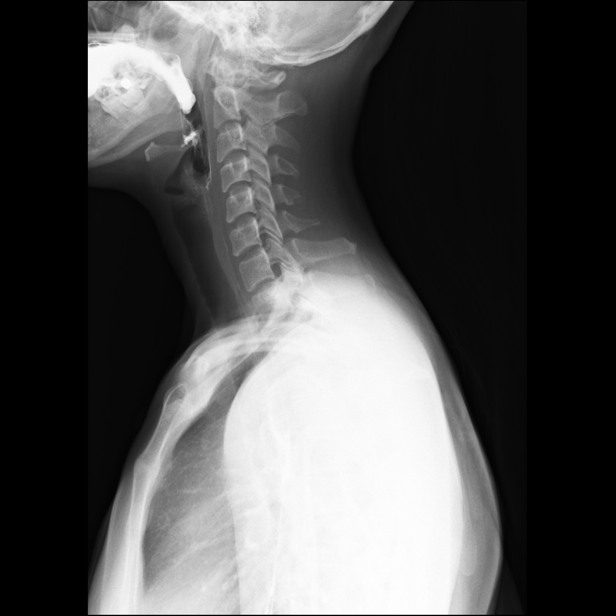
[frame 15/28]
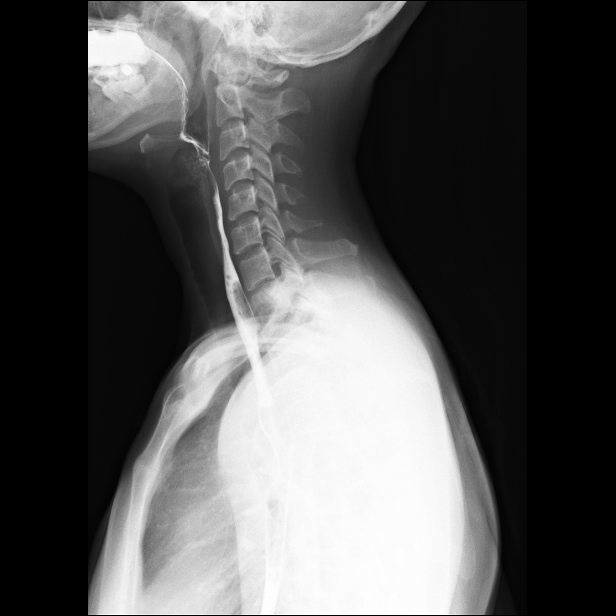
[frame 26/28]
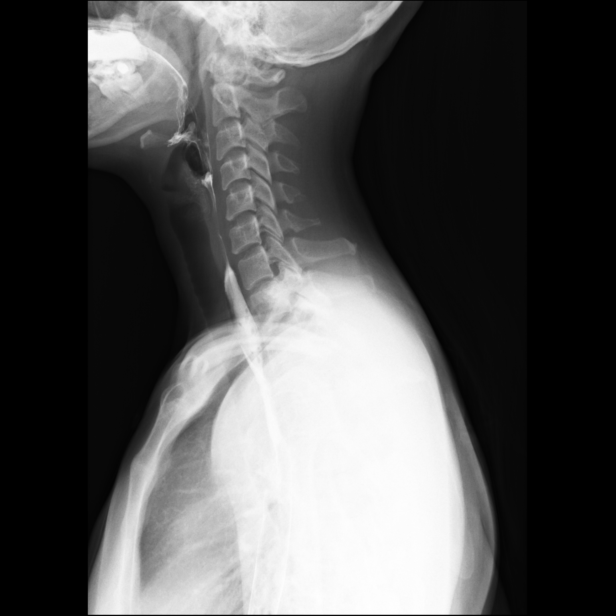

[Series 4: sequence · 0.28mm/px · 3 of 20 frames shown (4 of 6)]
[frame 4/20]
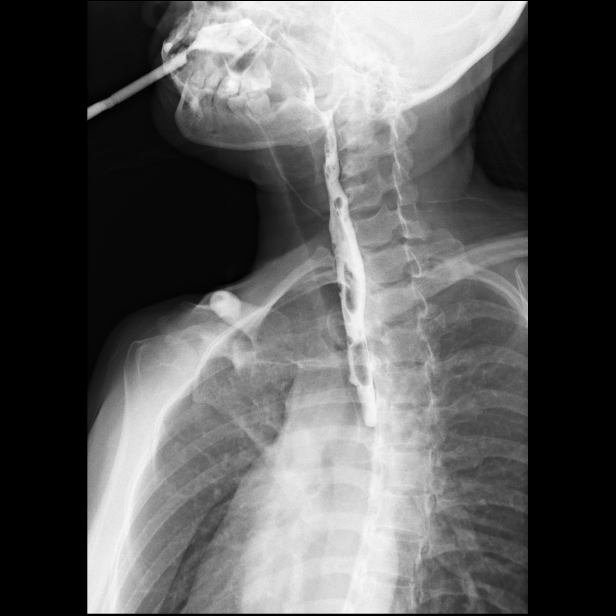
[frame 18/20]
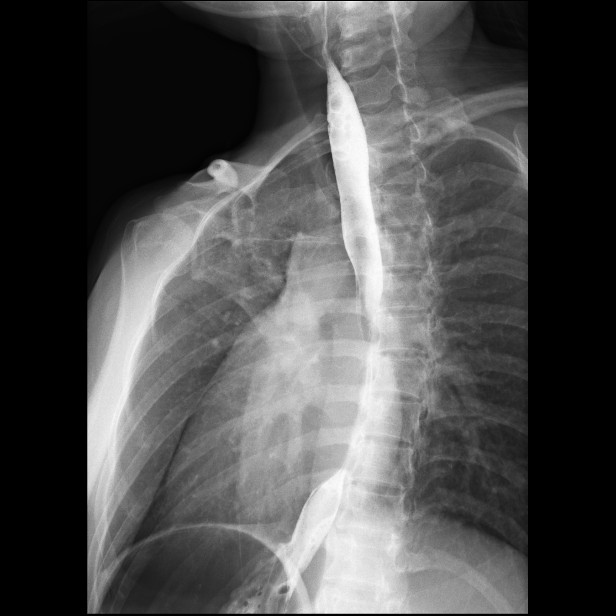
[frame 20/20]
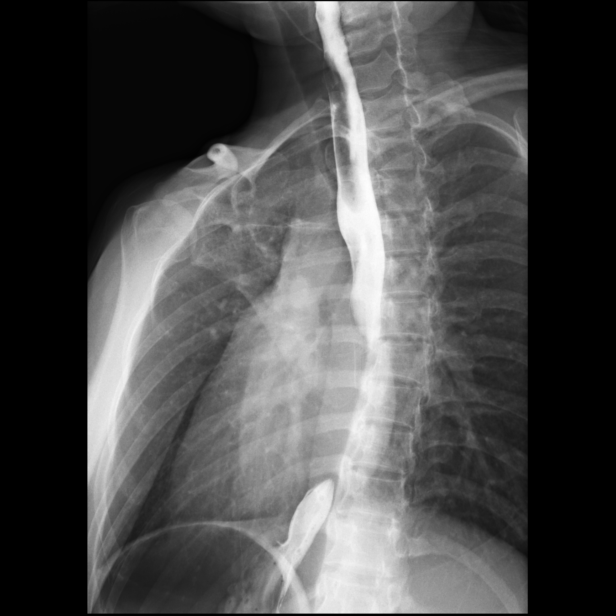

[Series 5: sequence · 0.28mm/px · 2 of 6 frames shown (5 of 6)]
[frame 3/6]
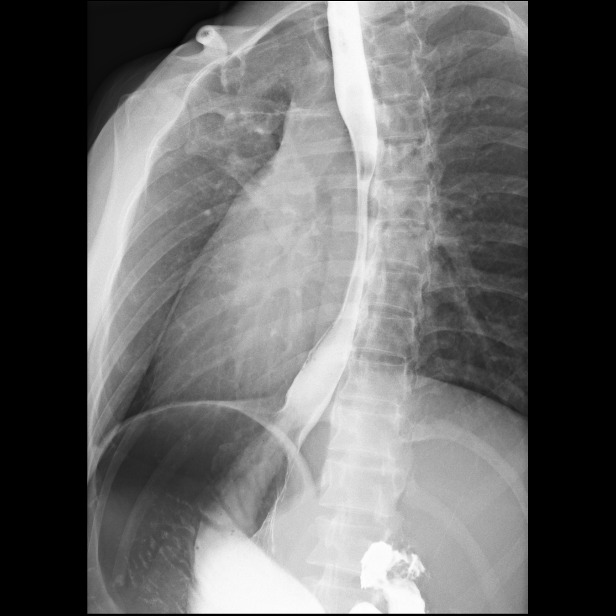
[frame 6/6]
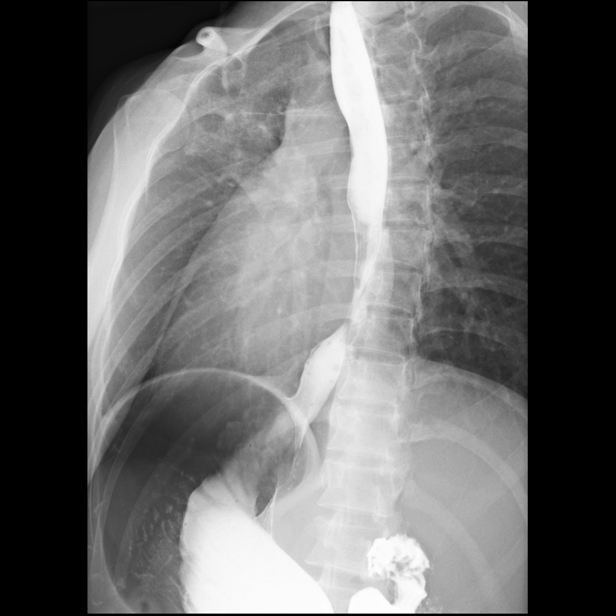

[Series 6: sequence · 0.28mm/px · 1 of 1 slices shown (6 of 6)]
[im 1/1]
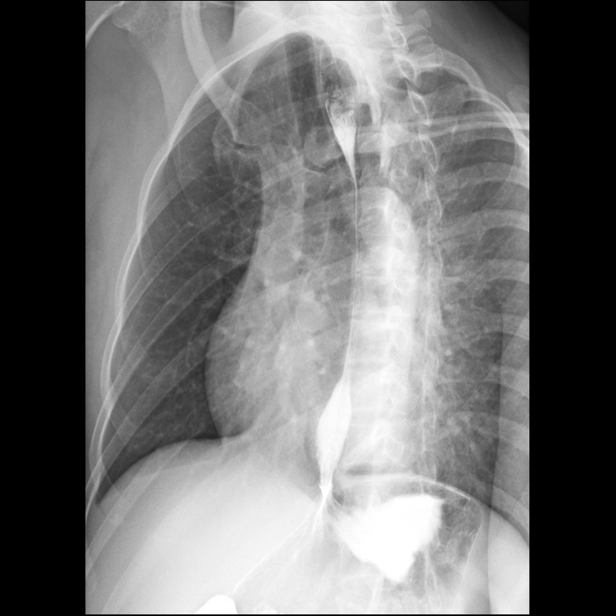

[Series 8: one shot · 1 of 1 slices shown]
[im 1/1]
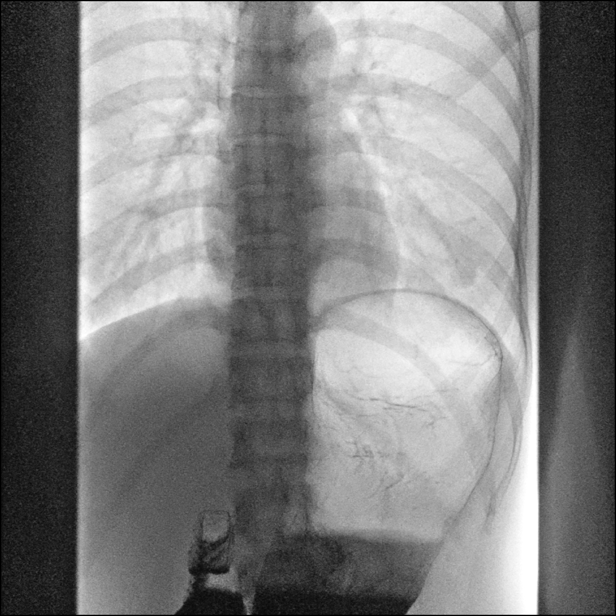

[14 of 22 positions shown; findings below may reference images not displayed]

FINDINGS: Initially double-contrast study was performed. The mucosa of the
esophagus appears normal. A single contrast study shows the
swallowing mechanism to be be normal. The patient did swallow small
swallows of barium in therefore this esophagus did not distend
optimally. In the lateral view there is no evidence of penetration
or aspiration. Esophageal peristalsis appears normal. No hiatal
hernia is seen. Mild gastroesophageal reflux is demonstrated at the
end of the study. A barium pill was given which passed into the
stomach without delay.
IMPRESSION: 1. Mild gastroesophageal reflux. Barium pill passes into the stomach
without delay.
2. No hiatal hernia is seen.
